# Patient Record
Sex: Female | Born: 1980 | Hispanic: Yes | Smoking: Never smoker
Health system: Southern US, Community
[De-identification: ages and names within clinical notes are randomized; demographics above are authoritative.]

## PROBLEM LIST (undated history)

## (undated) HISTORY — PX: APPENDECTOMY: SHX54

## (undated) HISTORY — PX: CHOLECYSTECTOMY: SHX55

---

## 2013-08-12 LAB — COMPREHENSIVE METABOLIC PANEL
Albumin: 4.1 g/dL (ref 3.4–5.0)
Alkaline Phosphatase: 82 U/L (ref 50–136)
Anion Gap: 9 (ref 7–16)
Bilirubin,Total: 1.2 mg/dL — ABNORMAL HIGH (ref 0.2–1.0)
Calcium, Total: 9 mg/dL (ref 8.5–10.1)
Chloride: 103 mmol/L (ref 98–107)
Co2: 25 mmol/L (ref 21–32)
Creatinine: 0.71 mg/dL (ref 0.60–1.30)
EGFR (Non-African Amer.): >60
Glucose: 112 mg/dL — ABNORMAL HIGH (ref 65–99)
Potassium: 3.7 mmol/L (ref 3.5–5.1)
SGOT(AST): 11 U/L — ABNORMAL LOW (ref 15–37)
SGPT (ALT): 16 U/L (ref 12–78)
Total Protein: 8.1 g/dL (ref 6.4–8.2)

## 2013-08-12 LAB — URINALYSIS, COMPLETE
Nitrite: NEGATIVE
Ph: 5 (ref 4.5–8.0)
Protein: 100
RBC,UR: 3 /HPF (ref 0–5)
Specific Gravity: 1.03 (ref 1.003–1.030)
Squamous Epithelial: 16

## 2013-08-12 LAB — CBC
HGB: 14 g/dL (ref 12.0–16.0)
MCH: 31.8 pg (ref 26.0–34.0)
MCV: 93 fL (ref 80–100)
Platelet: 201 10*3/uL (ref 150–440)
WBC: 13.8 10*3/uL — ABNORMAL HIGH (ref 3.6–11.0)

## 2013-08-13 ENCOUNTER — Ambulatory Visit: Payer: Self-pay | Admitting: Surgery

## 2014-11-14 IMAGING — CT CT ABD-PELV W/ CM
1 of 2 series · 15 of 32 positions shown, 19 images · non-contrast
Comparison: none

REASON FOR EXAM: (1) RLQ r/o appendicitis; (2) RLQ r/o appendicitis
COMMENTS:

PROCEDURE:     CT  - CT ABDOMEN / PELVIS  W  - August 13, 2013  [DATE]
RESULT:     CT abdomen and pelvis dated 08/13/1919 pain
TECHNIQUE: Helical 3 mm sections were obtained from lung bases the pubic
symphysis status post intravenous administration of 100 mL of Osovue-4II.

[Series 2: 3mm soft tissue · axial · 0.68mm/px · z∈[-1080,-646]mm · 15 of 159 slices shown, 19 images]
[im 7/159  soft-tissue]
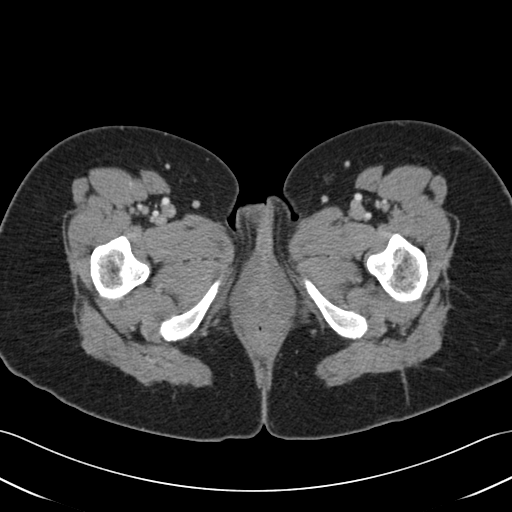
[im 7/159  bone]
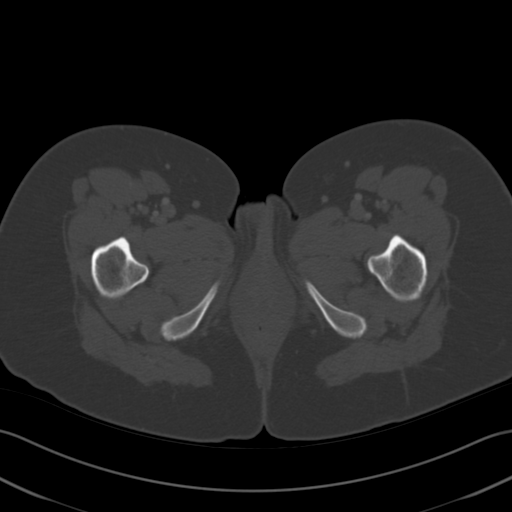
[im 19/159  soft-tissue]
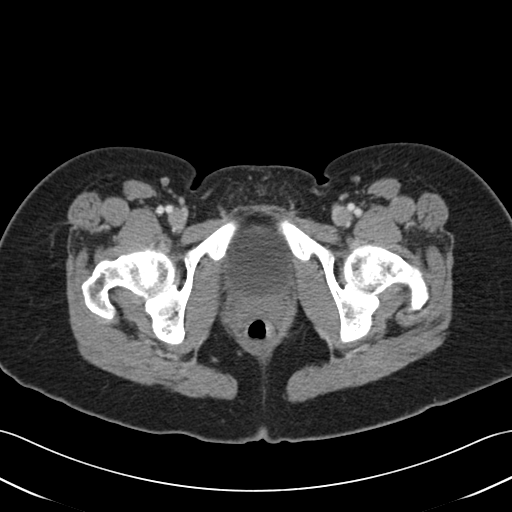
[im 32/159  soft-tissue]
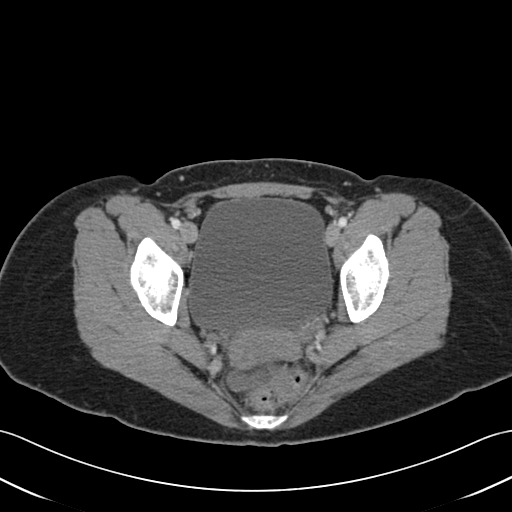
[im 45/159  soft-tissue]
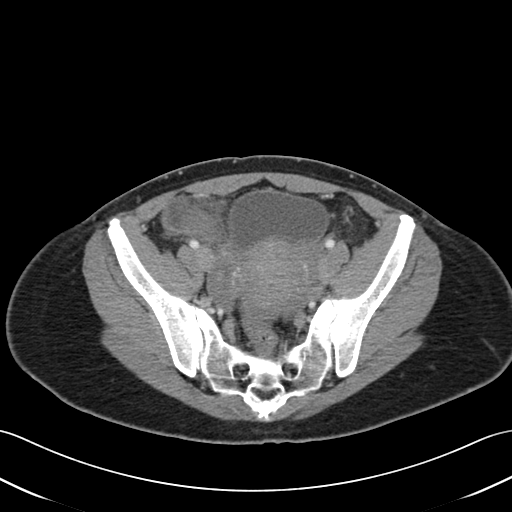
[im 57/159  soft-tissue]
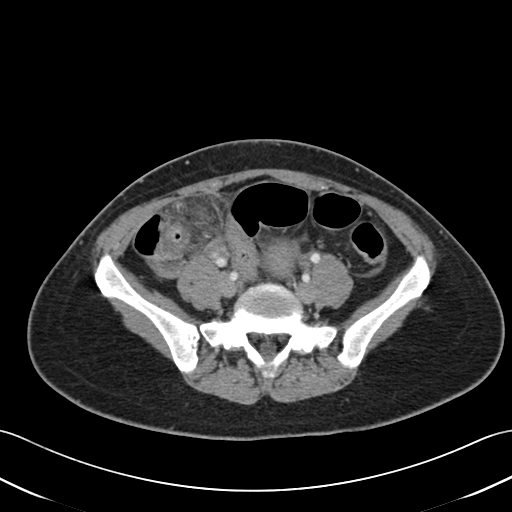
[im 70/159  soft-tissue]
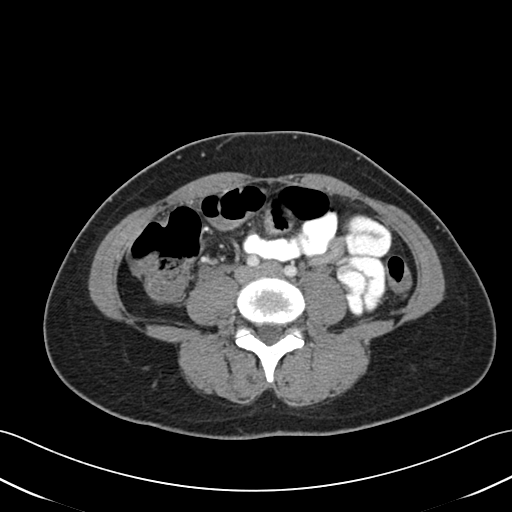
[im 83/159  soft-tissue]
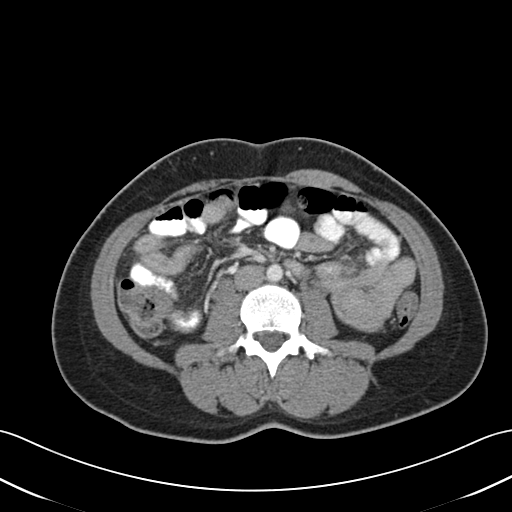
[im 89/159  soft-tissue]
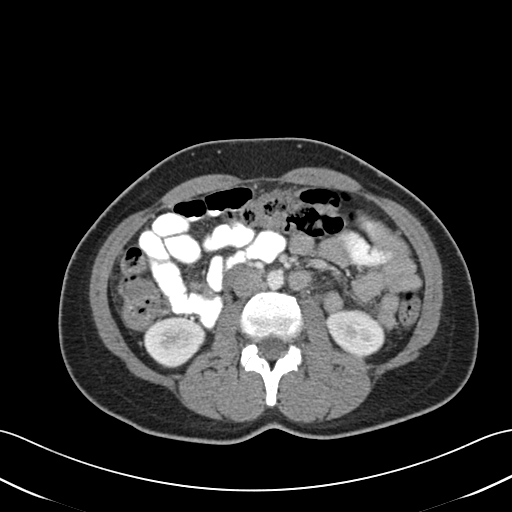
[im 102/159  soft-tissue]
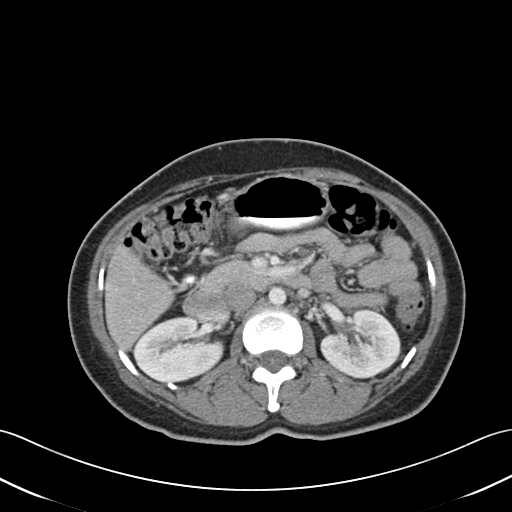
[im 102/159  bone]
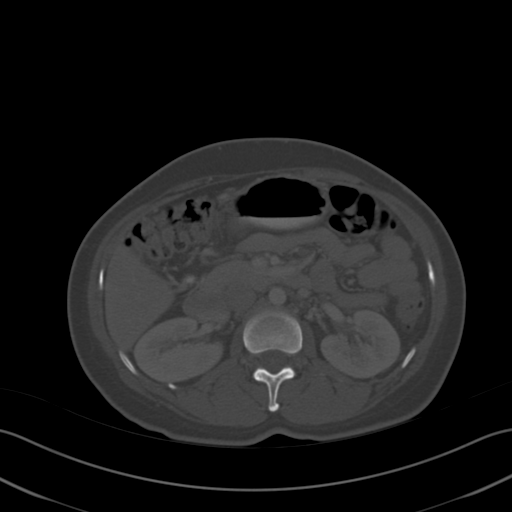
[im 114/159  soft-tissue]
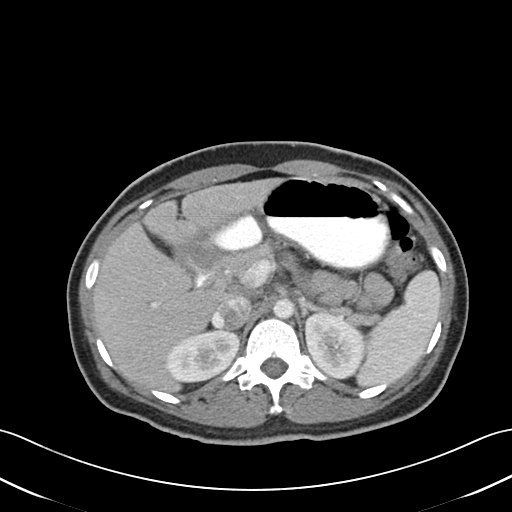
[im 127/159  soft-tissue]
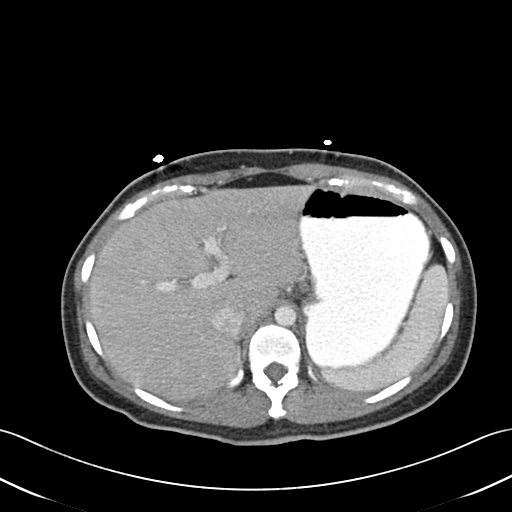
[im 133/159  lung]
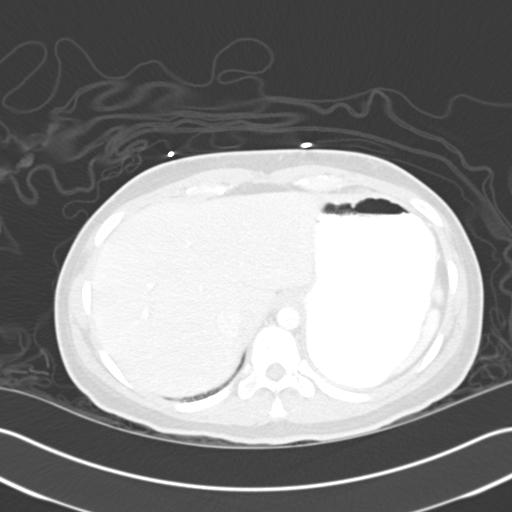
[im 140/159  soft-tissue]
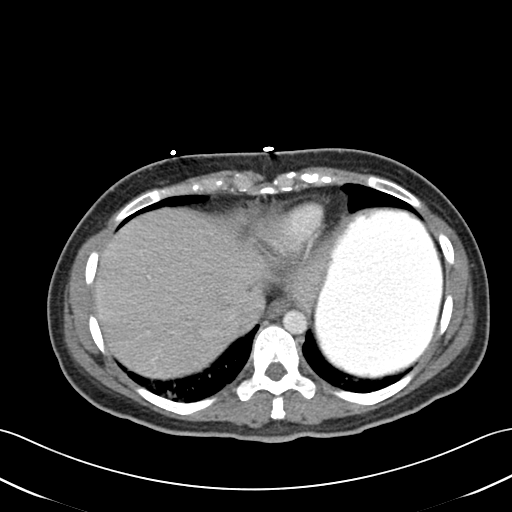
[im 140/159  lung]
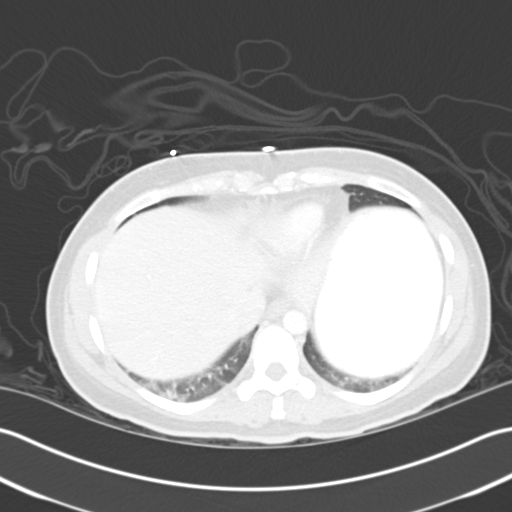
[im 146/159  lung]
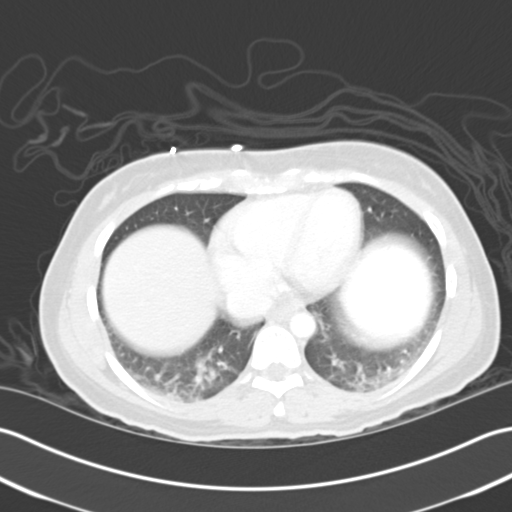
[im 152/159  soft-tissue]
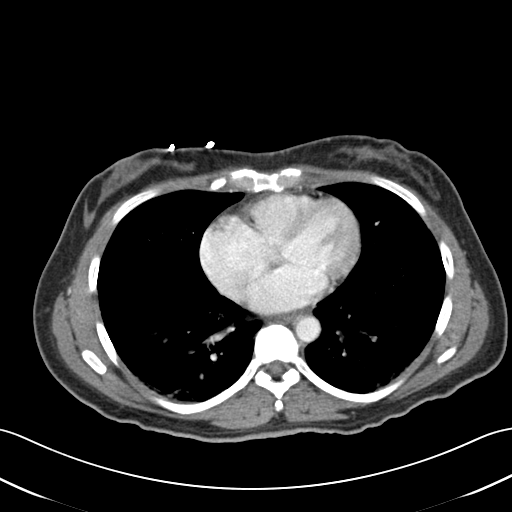
[im 152/159  lung]
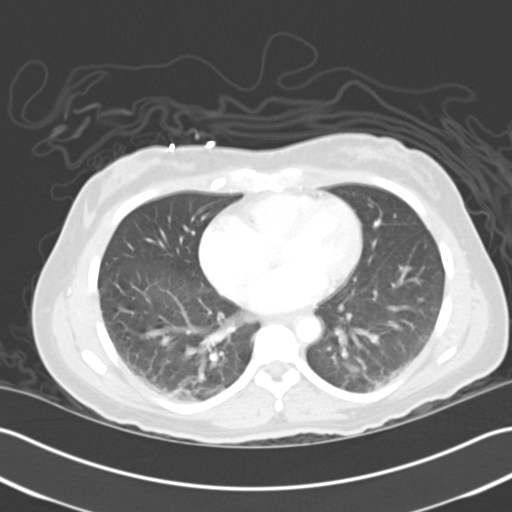

[15 of 32 positions shown; findings below may reference images not displayed]

FINDINGS: Minimal bibasal atelectasis appreciated. Patient status post
cholecystectomy. The liver, pancreas, spleen, adrenals kidneys are
unremarkable. There is no intra nor extrahepatic biliary ductal dilatation.

The appendix is dilated at 1.5 cm. There is diffuse distention and
inflammatory change within the appendiceal fat. Periappendiceal fluid is
identified without associated drainable loculated fluid collections.

Urinary bladder is distended. No evidence of pelvic free fluid loculated
fluid collections.
IMPRESSION: Acute appendicitis without evidence of perforation.
2. Dr. Pebrase of the emergency department was informed of these findings
via a preliminary faxed report.

## 2015-02-07 NOTE — H&P (Signed)
   Subjective/Chief Complaint RLQ pain x 2 days, nausea/vomiting   History of Present Illness [name] is a pleasant 34 yo F who presents with 2 days of RLQ pain.  Began acutely.  Worsening.  Also with nausea/vomiting.  Has never had before.  + subjective fevers/chills.   Past History s/p cholecystectomy   Past Med/Surgical Hx:  Cholecystectomy:   ALLERGIES:  No Known Allergies:   Family and Social History:  Family History Non-Contributory   Social History negative tobacco, negative ETOH, negative Illicit drugs   Place of Living Home   Review of Systems:  Subjective/Chief Complaint RLQ pain/N/V   Fever/Chills Yes   Cough No   Sputum No   Abdominal Pain Yes   Diarrhea No   Constipation No   Nausea/Vomiting Yes   SOB/DOE No   Chest Pain No   Dysuria No   Tolerating Diet No  Nauseated  Vomiting   Physical Exam:  GEN well developed, well nourished, no acute distress, Appears uncomfortable   HEENT pink conjunctivae, PERRL, moist oral mucosa   RESP normal resp effort  clear BS  no use of accessory muscles   CARD regular rate  no murmur  No LE edema   ABD positive tenderness  no hernia  soft  normal BS   EXTR negative cyanosis/clubbing, negative edema   SKIN normal to palpation, No rashes, No ulcers, skin turgor good   NEURO cranial nerves intact, negative rigidity, negative tremor, follows commands, strength:, motor/sensory function intact   PSYCH A+O to time, place, person, good insight, anxious    Assessment/Admission Diagnosis 34 yo F with RLQ pain/ nausea/vomiting.  + leukocytosis.  + enlarged fluid filled appendix with periappendiceal stranding.  Clinical and radiographic appendicitis.   Plan To OR for lap appendectomy   Electronic Signatures: Jarvis NewcomerLundquist, Keland Peyton A (MD)  (Signed 27-Oct-14 01:34)  Authored: CHIEF COMPLAINT and HISTORY, PAST MEDICAL/SURGIAL HISTORY, ALLERGIES, FAMILY AND SOCIAL HISTORY, REVIEW OF SYSTEMS, PHYSICAL EXAM,  ASSESSMENT AND PLAN   Last Updated: 27-Oct-14 01:34 by Jarvis NewcomerLundquist, Lennin Osmond A (MD)

## 2015-02-07 NOTE — Op Note (Signed)
PATIENT NAME:  Stephanie BellmanCHEVERRIA PINEDA, Jalynn MR#:  [id number] DATE OF BIRTH:  12-21-1980  DATE OF PROCEDURE:  08/13/2013   PREOPERATIVE DIAGNOSIS: Acute appendicitis.   POSTOPERATIVE DIAGNOSIS: Acute appendicitis.   PROCEDURE PERFORMED: Laparoscopic appendectomy.   ANESTHESIA: General.   ESTIMATED BLOOD LOSS: 10 mL.   COMPLICATIONS: None.   SPECIMEN: Appendix.   INDICATION FOR SURGERY: [name] is a pleasant 34 year old female who presents for two days of right lower quadrant pain, nausea and vomiting. She had a leukocytosis and CT finding, which would suggest acute appendicitis, she was thus brought to the operating room suite for laparoscopic appendectomy.   DETAILS OF THE PROCEDURE: Are as follows. Informed consent was obtained. [name] was brought to the operating room suite. She was placed on the operating room table. She was induced. Endotracheal tube was placed. General anesthesia was administered. Her abdomen was then prepped and draped in standard surgical fashion. A timeout was then performed correctly identifying the patient name, operative site and procedure to be performed An infraumbilical incision was made. It was deepened down to the fascia. The fascia was incised. The peritoneum was entered. A finger sweep was used to insure there were no adhesions. Two stay sutures were placed through the fasciotomy. [facility] through the abdomen. The abdomen was insufflated. A 5 mm trocar was placed in the left lower quadrant and suprapubic region.  A large inflammatory mass was found in the right abdomen.  The appendix was meticulously dissected out from adhesed omentum. I then created a hole at the base of the appendix and the mesoappendix and I then stapled  across the base of the appendix flush with the cecum with an endoscopic stapler. I then used 2 staple loads to ligate the mesoappendix. The appendix was taken out through an Endo Catch bag through the  supraumbilical trocar. The abdomen was then irrigated. The staple lines were then examined and noted to be hemostatic. I then re-evaluated the anatomy. I was then able to identify the ovary, tube, terminal ileum and cecum. I was also able to visualize the ureter, which was still protected under peritoneum and the retroperitoneum. Trocar was then taken and placed under direct visualization. Abdomen was desufflated. The infraumbilical fascia was closed with a figure-of-eight 0 Vicryl. All skin incisions were then closed with interrupted 4-0 Monocryl deep dermal sutures. The wounds were then dressed with Steri-Strips, Telfa gauze and Tegaderm. The patient was then awoken, extubated and brought to the postanesthesia care unit. There were no immediate complications. Needle, sponge, and instrument counts correct at the end of the procedure.    ____________________________ Si Raiderhristopher A. Caitlinn Klinker, MD cal:sg D: 08/13/2013 03:49:10 ET T: 08/13/2013 07:05:40 ET JOB#: [id number]  cc: Cristal Deerhristopher A. Aftan Vint, MD, <Dictator> Jarvis NewcomerHRISTOPHER A Raechell Singleton MD ELECTRONICALLY SIGNED 08/20/2013 18:26

## 2017-01-07 ENCOUNTER — Encounter: Payer: Self-pay | Admitting: Emergency Medicine

## 2017-01-07 ENCOUNTER — Emergency Department: Payer: Self-pay

## 2017-01-07 ENCOUNTER — Emergency Department
Admission: EM | Admit: 2017-01-07 | Discharge: 2017-01-07 | Disposition: A | Discharge status: Home / Self Care | Payer: Self-pay | Attending: Emergency Medicine | Admitting: Emergency Medicine

## 2017-01-07 DIAGNOSIS — R509 Fever, unspecified: Secondary | ICD-10-CM | POA: Insufficient documentation

## 2017-01-07 DIAGNOSIS — R05 Cough: Secondary | ICD-10-CM | POA: Insufficient documentation

## 2017-01-07 DIAGNOSIS — J029 Acute pharyngitis, unspecified: Secondary | ICD-10-CM | POA: Insufficient documentation

## 2017-01-07 DIAGNOSIS — R69 Illness, unspecified: Secondary | ICD-10-CM

## 2017-01-07 DIAGNOSIS — J111 Influenza due to unidentified influenza virus with other respiratory manifestations: Secondary | ICD-10-CM

## 2017-01-07 DIAGNOSIS — R51 Headache: Secondary | ICD-10-CM | POA: Insufficient documentation

## 2017-01-07 MED ORDER — HYDROCOD POLST-CPM POLST ER 10-8 MG/5ML PO SUER
5.0000 mL | Freq: Once | ORAL | Status: AC
  Administered 2017-01-07: 5 mL via ORAL
  Filled 2017-01-07: qty 5

## 2017-01-07 MED ORDER — PSEUDOEPHEDRINE HCL ER 120 MG PO TB12: 120.0000 mg | ORAL_TABLET | Freq: Two times a day (BID) | ORAL | 0 refills | Status: AC | PRN

## 2017-01-07 MED ORDER — IBUPROFEN 600 MG PO TABS: 600.0000 mg | ORAL_TABLET | Freq: Three times a day (TID) | ORAL | 0 refills | Status: AC | PRN

## 2017-01-07 MED ORDER — KETOROLAC TROMETHAMINE 60 MG/2ML IM SOLN
60.0000 mg | Freq: Once | INTRAMUSCULAR | Status: AC
  Administered 2017-01-07: 60 mg via INTRAMUSCULAR
  Filled 2017-01-07: qty 2

## 2017-01-07 MED ORDER — HYDROCOD POLST-CPM POLST ER 10-8 MG/5ML PO SUER: 5.0000 mL | Freq: Two times a day (BID) | ORAL | 0 refills | Status: AC

## 2017-01-07 NOTE — ED Triage Notes (Signed)
Pt states that on Saturday she started having fever, headache, sore throat, and cough. Pt was seen at PCP office and diagnosed with flu. Pt states that she was given medication but she is still not feeling well. Pt states that she is still coughing and having trouble breathing. Pt in NAD in triage.

## 2017-01-07 NOTE — ED Notes (Signed)
Pt c/o dry cough and fever since Monday. Taking antibiotic from PCP with no relief.

## 2017-01-07 NOTE — ED Provider Notes (Signed)
[facility] Emergency Department Provider Note   ____________________________________________   First MD Initiated Contact with Patient 01/07/17 1710     (approximate)  I have reviewed the triage vital signs and the nursing notes.   HISTORY  Chief Complaint Cough    HPI Aleighna Hester Joslin is a 36 y.o. female patient complaining of fever, headache, sore throat, cough, and body aches for 6 days. Patient states she was diagnosed influenza 6 days ago and given Tamiflu. Patient states continue myalgia and cough. Patient also states hurts to take a deep breath. Patient denies nausea vomiting or diarrhea. No palliative measures for her complaint.   History reviewed. No pertinent past medical history.  There are no active problems to display for this patient.   Past Surgical History:  Procedure Laterality Date  . APPENDECTOMY    . CHOLECYSTECTOMY      Prior to Admission medications   Medication Sig Start Date End Date Taking? Authorizing Provider  chlorpheniramine-HYDROcodone (TUSSIONEX PENNKINETIC ER) 10-8 MG/5ML SUER Take 5 mLs by mouth 2 (two) times daily. 01/07/17   Joni Reining, PA-C  ibuprofen (ADVIL,MOTRIN) 600 MG tablet Take 1 tablet (600 mg total) by mouth every 8 (eight) hours as needed. 01/07/17   Joni Reining, PA-C  pseudoephedrine (SUDAFED) 120 MG 12 hr tablet Take 1 tablet (120 mg total) by mouth 2 (two) times daily as needed for congestion. 01/07/17 01/07/18  Joni Reining, PA-C    Allergies Patient has no known allergies.  No family history on file.  Social History Social History  Substance Use Topics  . Smoking status: Not on file  . Smokeless tobacco: Never Used  . Alcohol use No    Review of Systems Constitutional: Fever/chills. Body aches, and cough. Eyes: No visual changes. ENT: Sore throat Cardiovascular: Denies chest pain. Respiratory: Denies shortness of breath. Gastrointestinal: No abdominal pain.  No  nausea, no vomiting.  No diarrhea.  No constipation. Genitourinary: Negative for dysuria. Musculoskeletal: Negative for back pain. Skin: Negative for rash. Neurological: Negative for headaches, focal weakness or numbness.    ____________________________________________   PHYSICAL EXAM:  VITAL SIGNS: ED Triage Vitals  Enc Vitals Group     BP 01/07/17 1656 117/88     Pulse Rate 01/07/17 1655 (!) 103     Resp 01/07/17 1655 16     Temp 01/07/17 1655 98.7 F (37.1 C)     Temp Source 01/07/17 1655 Oral     SpO2 01/07/17 1655 100 %     Weight 01/07/17 1654 123 lb (55.8 kg)     Height --      Head Circumference --      Peak Flow --      Pain Score 01/07/17 1654 8     Pain Loc --      Pain Edu? --      Excl. in GC? --     Constitutional: Alert and oriented. Appears malaise  Eyes: Conjunctivae are normal. PERRL. EOMI. Head: Atraumatic. Nose: Edematous nasal tube was clear rhinorrhea Mouth/Throat: Mucous membranes are moist.  Oropharynx non-erythematous. Postnasal drainage Neck: No stridor.  No cervical spine tenderness to palpation. Hematological/Lymphatic/Immunilogical: No cervical lymphadenopathy. Cardiovascular: Normal rate, regular rhythm. Grossly normal heart sounds.  Good peripheral circulation. Respiratory: Normal respiratory effort.  No retractions. Lungs CTAB. No productive cough Gastrointestinal: Soft and nontender. No distention. No abdominal bruits. No CVA tenderness. Musculoskeletal: No lower extremity tenderness nor edema.  No joint effusions. Neurologic:  Normal speech and  language. No gross focal neurologic deficits are appreciated. No gait instability. Skin:  Skin is warm, dry and intact. No rash noted. Psychiatric: Mood and affect are normal. Speech and behavior are normal.  ____________________________________________   LABS (all labs ordered are listed, but only abnormal results are displayed)  Labs Reviewed - No data to  display ____________________________________________  EKG   ____________________________________________  RADIOLOGY  No acute findings on chest x-ray. ____________________________________________   PROCEDURES  Procedure(s) performed: None  Procedures  Critical Care performed: No  ____________________________________________   INITIAL IMPRESSION / ASSESSMENT AND PLAN / ED COURSE  Pertinent labs & imaging results that were available during my care of the patient were reviewed by me and considered in my medical decision making (see chart for details).  Viral illness. Patient given discharge care instructions. Patient given a prescription for Tussionex, Sudafed, and ibuprofen. Patient given a work no. Patient advised to follow-up family doctor condition persists.      ____________________________________________   FINAL CLINICAL IMPRESSION(S) / ED DIAGNOSES  Final diagnoses:  Influenza-like illness      NEW MEDICATIONS STARTED DURING THIS VISIT:  New Prescriptions   CHLORPHENIRAMINE-HYDROCODONE (TUSSIONEX PENNKINETIC ER) 10-8 MG/5ML SUER    Take 5 mLs by mouth 2 (two) times daily.   IBUPROFEN (ADVIL,MOTRIN) 600 MG TABLET    Take 1 tablet (600 mg total) by mouth every 8 (eight) hours as needed.   PSEUDOEPHEDRINE (SUDAFED) 120 MG 12 HR TABLET    Take 1 tablet (120 mg total) by mouth 2 (two) times daily as needed for congestion.     Note:  This document was prepared using Dragon voice recognition software and may include unintentional dictation errors.    Joni Reiningonald K Necha Harries, PA-C 01/07/17 1744    Merrily BrittleNeil Rifenbark, MD 01/07/17 Harrietta [id number]

## 2017-01-13 ENCOUNTER — Encounter: Payer: Self-pay | Admitting: Emergency Medicine

## 2017-01-13 ENCOUNTER — Inpatient Hospital Stay
Admission: EM | Admit: 2017-01-13 | Discharge: 2017-01-15 | DRG: 872 | Disposition: A | Discharge status: Home / Self Care | Payer: Medicaid Other | Attending: Internal Medicine | Admitting: Internal Medicine

## 2017-01-13 ENCOUNTER — Emergency Department: Payer: Medicaid Other

## 2017-01-13 DIAGNOSIS — E876 Hypokalemia: Secondary | ICD-10-CM | POA: Diagnosis present

## 2017-01-13 DIAGNOSIS — Z79899 Other long term (current) drug therapy: Secondary | ICD-10-CM | POA: Diagnosis not present

## 2017-01-13 DIAGNOSIS — Z9049 Acquired absence of other specified parts of digestive tract: Secondary | ICD-10-CM

## 2017-01-13 DIAGNOSIS — A419 Sepsis, unspecified organism: Principal | ICD-10-CM | POA: Diagnosis present

## 2017-01-13 DIAGNOSIS — J020 Streptococcal pharyngitis: Secondary | ICD-10-CM | POA: Diagnosis present

## 2017-01-13 DIAGNOSIS — N390 Urinary tract infection, site not specified: Secondary | ICD-10-CM | POA: Diagnosis present

## 2017-01-13 DIAGNOSIS — B349 Viral infection, unspecified: Secondary | ICD-10-CM

## 2017-01-13 LAB — BASIC METABOLIC PANEL
Anion gap: 8 (ref 5–15)
BUN: 9 mg/dL (ref 6–20)
CHLORIDE: 101 mmol/L (ref 101–111)
CO2: 22 mmol/L (ref 22–32)
Calcium: 8.4 mg/dL — ABNORMAL LOW (ref 8.9–10.3)
Creatinine, Ser: 0.68 mg/dL (ref 0.44–1.00)
GFR calc Af Amer: >60 mL/min (ref >60)
GFR calc non Af Amer: >60 mL/min (ref >60)
GLUCOSE: 101 mg/dL — AB (ref 65–99)
POTASSIUM: 3.4 mmol/L — AB (ref 3.5–5.1)
Sodium: 131 mmol/L — ABNORMAL LOW (ref 135–145)

## 2017-01-13 LAB — CBC
HCT: 38.7 % (ref 35.0–47.0)
HEMOGLOBIN: 13.4 g/dL (ref 12.0–16.0)
MCH: 31.9 pg (ref 26.0–34.0)
MCHC: 34.6 g/dL (ref 32.0–36.0)
MCV: 92.1 fL (ref 80.0–100.0)
Platelets: 167 10*3/uL (ref 150–440)
RBC: 4.2 MIL/uL (ref 3.80–5.20)
RDW: 12.6 % (ref 11.5–14.5)
WBC: 12.6 10*3/uL — ABNORMAL HIGH (ref 3.6–11.0)

## 2017-01-13 LAB — TROPONIN I

## 2017-01-13 LAB — URINALYSIS, COMPLETE (UACMP) WITH MICROSCOPIC
BILIRUBIN URINE: NEGATIVE
Glucose, UA: NEGATIVE mg/dL
Ketones, ur: 20 mg/dL — AB
LEUKOCYTES UA: NEGATIVE
Nitrite: NEGATIVE
PROTEIN: NEGATIVE mg/dL
SPECIFIC GRAVITY, URINE: 1.005 (ref 1.005–1.030)
pH: 7 (ref 5.0–8.0)

## 2017-01-13 LAB — INFLUENZA PANEL BY PCR (TYPE A & B)
INFLBPCR: NEGATIVE
Influenza A By PCR: NEGATIVE

## 2017-01-13 LAB — TSH: TSH: 0.372 u[IU]/mL (ref 0.350–4.500)

## 2017-01-13 LAB — PREGNANCY, URINE: Preg Test, Ur: NEGATIVE

## 2017-01-13 LAB — LACTIC ACID, PLASMA: LACTIC ACID, VENOUS: 1.1 mmol/L (ref 0.5–1.9)

## 2017-01-13 LAB — MONONUCLEOSIS SCREEN: Mono Screen: NEGATIVE

## 2017-01-13 LAB — POCT RAPID STREP A: Streptococcus, Group A Screen (Direct): POSITIVE — AB

## 2017-01-13 MED ORDER — ONDANSETRON HCL 4 MG/2ML IJ SOLN: 4.0000 mg | Freq: Four times a day (QID) | INTRAMUSCULAR | Status: DC | PRN

## 2017-01-13 MED ORDER — KETOROLAC TROMETHAMINE 30 MG/ML IJ SOLN
15.0000 mg | Freq: Once | INTRAMUSCULAR | Status: AC
  Administered 2017-01-13: 15 mg via INTRAVENOUS
  Filled 2017-01-13: qty 1

## 2017-01-13 MED ORDER — CEFTRIAXONE SODIUM-DEXTROSE 1-3.74 GM-% IV SOLR
1.0000 g | Freq: Once | INTRAVENOUS | Status: AC
  Administered 2017-01-13: 1 g via INTRAVENOUS
  Filled 2017-01-13: qty 50

## 2017-01-13 MED ORDER — DOXYCYCLINE HYCLATE 100 MG PO CAPS: 100.0000 mg | ORAL_CAPSULE | Freq: Two times a day (BID) | ORAL | 0 refills | Status: DC

## 2017-01-13 MED ORDER — ACETAMINOPHEN 325 MG PO TABS
650.0000 mg | ORAL_TABLET | Freq: Four times a day (QID) | ORAL | Status: DC | PRN
Start: 2017-01-13 — End: 2017-01-15
  Administered 2017-01-13 – 2017-01-14 (×2): 650 mg via ORAL
  Filled 2017-01-13 (×2): qty 2

## 2017-01-13 MED ORDER — ONDANSETRON HCL 4 MG PO TABS: 4.0000 mg | ORAL_TABLET | Freq: Four times a day (QID) | ORAL | Status: DC | PRN

## 2017-01-13 MED ORDER — IBUPROFEN 400 MG PO TABS: 600.0000 mg | ORAL_TABLET | Freq: Four times a day (QID) | ORAL | Status: DC | PRN

## 2017-01-13 MED ORDER — KETOROLAC TROMETHAMINE 15 MG/ML IJ SOLN
15.0000 mg | Freq: Four times a day (QID) | INTRAMUSCULAR | Status: DC | PRN
  Administered 2017-01-14 (×3): 15 mg via INTRAVENOUS
  Filled 2017-01-13 (×3): qty 1

## 2017-01-13 MED ORDER — DEXTROSE 5 % IV SOLN: 1.0000 g | Freq: Once | INTRAVENOUS | Status: DC

## 2017-01-13 MED ORDER — SODIUM CHLORIDE 0.9 % IV BOLUS (SEPSIS)
500.0000 mL | Freq: Once | INTRAVENOUS | Status: AC
  Administered 2017-01-13: 500 mL via INTRAVENOUS

## 2017-01-13 MED ORDER — ONDANSETRON HCL 4 MG PO TABS: 4.0000 mg | ORAL_TABLET | Freq: Three times a day (TID) | ORAL | 0 refills | Status: AC | PRN

## 2017-01-13 MED ORDER — AZITHROMYCIN 500 MG PO TABS
500.0000 mg | ORAL_TABLET | Freq: Once | ORAL | Status: AC
  Administered 2017-01-13: 500 mg via ORAL
  Filled 2017-01-13: qty 1

## 2017-01-13 MED ORDER — ONDANSETRON HCL 4 MG/2ML IJ SOLN
4.0000 mg | Freq: Once | INTRAMUSCULAR | Status: AC
  Administered 2017-01-13: 4 mg via INTRAVENOUS
  Filled 2017-01-13: qty 2

## 2017-01-13 MED ORDER — ACETAMINOPHEN 650 MG RE SUPP: 650.0000 mg | Freq: Four times a day (QID) | RECTAL | Status: DC | PRN

## 2017-01-13 MED ORDER — SODIUM CHLORIDE 0.9 % IV BOLUS (SEPSIS)
1000.0000 mL | Freq: Once | INTRAVENOUS | Status: AC
  Administered 2017-01-13: 1000 mL via INTRAVENOUS

## 2017-01-13 MED ORDER — SODIUM CHLORIDE 0.9 % IV SOLN
INTRAVENOUS | Status: DC
Start: 2017-01-13 — End: 2017-01-15
  Administered 2017-01-13: 100 mL/h via INTRAVENOUS
  Administered 2017-01-14: 17:00:00 via INTRAVENOUS

## 2017-01-13 MED ORDER — ENOXAPARIN SODIUM 40 MG/0.4ML ~~LOC~~ SOLN
40.0000 mg | SUBCUTANEOUS | Status: DC
  Administered 2017-01-13 – 2017-01-14 (×2): 40 mg via SUBCUTANEOUS
  Filled 2017-01-13 (×2): qty 0.4

## 2017-01-13 MED ORDER — SODIUM CHLORIDE 0.9% FLUSH
3.0000 mL | Freq: Two times a day (BID) | INTRAVENOUS | Status: DC
  Administered 2017-01-13: 3 mL via INTRAVENOUS

## 2017-01-13 MED ORDER — SODIUM CHLORIDE 0.9 % IV SOLN
3.0000 g | Freq: Four times a day (QID) | INTRAVENOUS | Status: DC
  Administered 2017-01-14 – 2017-01-15 (×6): 3 g via INTRAVENOUS
  Filled 2017-01-13 (×9): qty 3

## 2017-01-13 MED ORDER — HYDROCOD POLST-CPM POLST ER 10-8 MG/5ML PO SUER
5.0000 mL | Freq: Two times a day (BID) | ORAL | Status: DC
  Administered 2017-01-13 – 2017-01-15 (×4): 5 mL via ORAL
  Filled 2017-01-13 (×4): qty 5

## 2017-01-13 MED ORDER — ACETAMINOPHEN 500 MG PO TABS
1000.0000 mg | ORAL_TABLET | Freq: Once | ORAL | Status: AC
  Administered 2017-01-13: 1000 mg via ORAL
  Filled 2017-01-13: qty 2

## 2017-01-13 MED ORDER — SODIUM CHLORIDE 0.9 % IV BOLUS (SEPSIS)
250.0000 mL | Freq: Once | INTRAVENOUS | Status: AC
  Administered 2017-01-13: 250 mL via INTRAVENOUS

## 2017-01-13 MED ORDER — SODIUM CHLORIDE 0.9 % IV SOLN: 250.0000 mL | INTRAVENOUS | Status: DC | PRN

## 2017-01-13 MED ORDER — SODIUM CHLORIDE 0.9% FLUSH: 3.0000 mL | INTRAVENOUS | Status: DC | PRN

## 2017-01-13 NOTE — ED Provider Notes (Signed)
-----------------------------------------   5:17 PM on 01/13/2017 -----------------------------------------  Patient care assumed from Dr. Don PerkingVeronese. For the past one week the patient has been coughing with fever bodyaches generalized weakness and fatigue. Upon arrival the patient had a fever of 103, is tachycardic and has remained hypotensive despite adequate IV fluids in the emergency department. Patient continues have a frequent cough during examination. Patient's labs show a positive strep throat screen. However given the patient's significant cough congestion bodyaches headache sore throat, I find it hard to believe that strep throat would be the cause of all of her symptoms and she is possibly streptococcal carrier. Patient is urinalysis shows ketones and many bacteria although does not show any white cells or red cells. Patient's pregnancy test is negative. Lactic acid is 1.1. Influenza PCR is negative. Patient's chemistry is nonrevealing. CBC shows a mild leukocytosis of 12,600, otherwise normal. Mononucleosis is negative. Chest x-rays negative. Patient states she continues to feel unwell. Frequent cough during examination. Patient remains hypotensive. Patient has received IV antibiotics in the emergency department which should be sufficient coverage for both streptococcal infection as well as urinary tract infection. Given the patient's persistent hypotension and she will likely require admission to the hospital. Currently awaiting Spanish interpreter to speak more in depth to the patient regarding her workup today.  Discussed the results with the Spanish interpreter present. Patient remains hypotensive currently 82 systolic. As the patient continues to have fevers with hypotension we'll admit to the hospital for further workup and continued treatment.   Minna AntisKevin Vinia Jemmott, MD 01/13/17 1736

## 2017-01-13 NOTE — ED Notes (Signed)
Interpretor paged for dr Lenard Lancepaduchowski.

## 2017-01-13 NOTE — ED Notes (Signed)
Dr Lenard Lancepaduchowski notified of blood pressure

## 2017-01-13 NOTE — Progress Notes (Signed)
Pharmacy Antibiotic Note  Stephanie Sims is a 36 y.o. female admitted on 01/13/2017 with sepsis secondary to strep pharyngitis.  Pharmacy has been consulted for ampicillin/sulbactam dosing.  Patient received one dose of CTX and azithromycin in the ED. Is being started on ampicillin/sulbactam on admission.  Plan: Will order ampicillin/sulbactam 3 g IV q6h   Height: 5\' 2"  (157.5 cm) Weight: 123 lb (55.8 kg) IBW/kg (Calculated) : 50.1  Temp (24hrs), Avg:101.9 F (38.8 C), Min:100.7 F (38.2 C), Max:103 F (39.4 C)   Recent Labs Lab 01/13/17 1335 01/13/17 1403  WBC 12.6*  --   CREATININE 0.68  --   LATICACIDVEN  --  1.1    Estimated Creatinine Clearance: 76.9 mL/min (by C-G formula based on SCr of 0.68 mg/dL).    No Known Allergies  Antimicrobials this admission: CTX and azithromycin one dose in ED on 3/29 Ampicillin/sulbactam 3/29 >>   Dose adjustments this admission:  Microbiology results: 3/29 BCx: Sent 3/29 UCx: Sent   Thank you for allowing pharmacy to be a part of this patient's care.  Cindi CarbonMary M Amarian Botero, PharmD, BCPS Clinical Pharmacist 01/13/2017 6:52 PM

## 2017-01-13 NOTE — ED Notes (Signed)
CODE SEPSIS CALLED TO DOUG AT [facility] 

## 2017-01-13 NOTE — ED Provider Notes (Signed)
[facility] Emergency Department Provider Note  ____________________________________________  Time seen: Approximately 1:58 PM  I have reviewed the triage vital signs and the nursing notes.   HISTORY  Chief Complaint Near Syncope   HPI Stephanie Sims is a 36 y.o. female no significant past medical history who presents for evaluation of fever and body aches. Patient reports that 2 weeks ago she was diagnosed with the flu and finished a course of Tamiflu. Her fevers went away and started again a week ago. For the last week she has had fever as high as 103F, body aches, cough, congestion, sore throat, HA, nausea, multiple episodes of nonbloody nonbilious emesis. She denies diarrhea, chest pain, abdominal pain, dysuria hematuria, vaginal discharge. Patient hasn't been eating and drinking for the last few days.  History reviewed. No pertinent past medical history.  Patient Active Problem List   Diagnosis Date Noted  . Sepsis (HCC) 01/13/2017    Past Surgical History:  Procedure Laterality Date  . APPENDECTOMY    . CHOLECYSTECTOMY      Prior to Admission medications   Medication Sig Start Date End Date Taking? Authorizing Provider  chlorpheniramine-HYDROcodone (TUSSIONEX PENNKINETIC ER) 10-8 MG/5ML SUER Take 5 mLs by mouth 2 (two) times daily. 01/07/17  Yes Joni Reining, PA-C  ibuprofen (ADVIL,MOTRIN) 600 MG tablet Take 1 tablet (600 mg total) by mouth every 8 (eight) hours as needed. 01/07/17  Yes Joni Reining, PA-C  pseudoephedrine (SUDAFED) 120 MG 12 hr tablet Take 1 tablet (120 mg total) by mouth 2 (two) times daily as needed for congestion. 01/07/17 01/07/18 Yes Joni Reining, PA-C  doxycycline (VIBRAMYCIN) 100 MG capsule Take 1 capsule (100 mg total) by mouth 2 (two) times daily. 01/13/17 01/20/17  Nita Sickle, MD  ondansetron (ZOFRAN) 4 MG tablet Take 1 tablet (4 mg total) by mouth every 8 (eight) hours as needed for nausea or  vomiting. 01/13/17   Nita Sickle, MD    Allergies Patient has no known allergies.  History reviewed. No pertinent family history.  Social History Social History  Substance Use Topics  . Smoking status: Never Smoker  . Smokeless tobacco: Never Used  . Alcohol use No    Review of Systems  Constitutional: + fever, chills, body aches, generalized weakness Eyes: Negative for visual changes. ENT: + sore throat. Neck: No neck pain  Cardiovascular: Negative for chest pain. Respiratory: Negative for shortness of breath. + cough Gastrointestinal: Negative for abdominal pain, diarrhea. + N/V Genitourinary: Negative for dysuria. Musculoskeletal: Negative for back pain. Skin: Negative for rash. Neurological: Negative for  weakness or numbness. + HA Psych: No SI or HI  ____________________________________________   PHYSICAL EXAM:  VITAL SIGNS: ED Triage Vitals  Enc Vitals Group     BP 01/13/17 1321 (!) 93/58     Pulse Rate 01/13/17 1321 (!) 111     Resp 01/13/17 1321 18     Temp 01/13/17 1321 (!) 103 F (39.4 C)     Temp Source 01/13/17 1321 Oral     SpO2 01/13/17 1321 100 %     Weight 01/13/17 1329 123 lb (55.8 kg)     Height 01/13/17 1329 5\' 2"  (1.575 m)     Head Circumference --      Peak Flow --      Pain Score 01/13/17 1329 10     Pain Loc --      Pain Edu? --      Excl. in GC? --  Constitutional: Alert and oriented, looks dehydrated and mild distress due to body aches.  HEENT:      Head: Normocephalic and atraumatic.         Eyes: Conjunctivae are normal. Sclera is non-icteric. EOMI. PERRL      Mouth/Throat: Mucous membranes are dry. Oropharynx is clear with no exudates       Neck: Supple with no signs of meningismus. Cardiovascular: Tachycardic with regular rhythm. No murmurs, gallops, or rubs. 2+ symmetrical distal pulses are present in all extremities. No JVD. Respiratory: Normal respiratory effort. Lungs are clear to auscultation bilaterally. No  wheezes, crackles, or rhonchi.  Gastrointestinal: Soft, non tender, and non distended with positive bowel sounds. No rebound or guarding. Genitourinary: No CVA tenderness. Musculoskeletal: Nontender with normal range of motion in all extremities. No edema, cyanosis, or erythema of extremities. Neurologic: Normal speech and language. Face is symmetric. Moving all extremities. No gross focal neurologic deficits are appreciated. Skin: Skin is warm, dry and intact. No rash noted. Psychiatric: Mood and affect are normal. Speech and behavior are normal.  ____________________________________________   LABS (all labs ordered are listed, but only abnormal results are displayed)  Labs Reviewed  BASIC METABOLIC PANEL - Abnormal; Notable for the following:       Result Value   Sodium 131 (*)    Potassium 3.4 (*)    Glucose, Bld 101 (*)    Calcium 8.4 (*)    All other components within normal limits  CBC - Abnormal; Notable for the following:    WBC 12.6 (*)    All other components within normal limits  URINALYSIS, COMPLETE (UACMP) WITH MICROSCOPIC - Abnormal; Notable for the following:    Color, Urine YELLOW (*)    APPearance CLEAR (*)    Hgb urine dipstick SMALL (*)    Ketones, ur 20 (*)    Bacteria, UA MANY (*)    Squamous Epithelial / LPF 0-5 (*)    All other components within normal limits  CBC - Abnormal; Notable for the following:    RBC 3.58 (*)    Hemoglobin 11.5 (*)    HCT 33.0 (*)    Platelets 140 (*)    All other components within normal limits  BASIC METABOLIC PANEL - Abnormal; Notable for the following:    Potassium 3.3 (*)    Chloride 112 (*)    Calcium 7.4 (*)    Anion gap 3 (*)    All other components within normal limits  CBC WITH DIFFERENTIAL/PLATELET - Abnormal; Notable for the following:    RBC 3.46 (*)    Hemoglobin 11.2 (*)    HCT 32.1 (*)    Platelets 144 (*)    All other components within normal limits  BASIC METABOLIC PANEL - Abnormal; Notable for the  following:    Chloride 112 (*)    Calcium 7.8 (*)    Anion gap 3 (*)    All other components within normal limits  POCT RAPID STREP A - Abnormal; Notable for the following:    Streptococcus, Group A Screen (Direct) POSITIVE (*)    All other components within normal limits  CULTURE, BLOOD (ROUTINE X 2)  CULTURE, BLOOD (ROUTINE X 2)  URINE CULTURE  LACTIC ACID, PLASMA  TROPONIN I  PREGNANCY, URINE  INFLUENZA PANEL BY PCR (TYPE A & B)  MONONUCLEOSIS SCREEN  HEMOGLOBIN A1C  TSH  HIV ANTIBODY (ROUTINE TESTING)  CBG MONITORING, ED   ____________________________________________  EKG  ED ECG REPORT I, [place]  Don Perking, the attending physician, personally viewed and interpreted this ECG.  NSR, normal intervals, normal axis, no STE or depressions. NO prior for comparison ____________________________________________  RADIOLOGY  CXR:  Negative ____________________________________________   PROCEDURES  Procedure(s) performed: None Procedures Critical Care performed: yes  CRITICAL CARE Performed by: Nita Sickle  ?  Total critical care time: 35 min  Critical care time was exclusive of separately billable procedures and treating other patients.  Critical care was necessary to treat or prevent imminent or life-threatening deterioration.  Critical care was time spent personally by me on the following activities: development of treatment plan with patient and/or surrogate as well as nursing, discussions with consultants, evaluation of patient's response to treatment, examination of patient, obtaining history from patient or surrogate, ordering and performing treatments and interventions, ordering and review of laboratory studies, ordering and review of radiographic studies, pulse oximetry and re-evaluation of patient's condition.  ____________________________________________   INITIAL IMPRESSION / ASSESSMENT AND PLAN / ED COURSE  36 y.o. female no significant past  medical history who presents for evaluation of fever and body aches. On arrival patient meets sepsis criteria with fever of 103F, tachycardic to 111 and hypotensive with BP of 93/58, she looks dehydrated on exam, lungs are clear, no meningeal signs, abdomen is soft. We'll do chest x-ray to rule out pneumonia, check influenza swab as it is unclear if patient was diagnosed with Flu based on clinical exam or if she had swab done. Also patient could possibly have a different type of Influenza. Sepsis protocol initiated with IVF, will hold abx for now, will give toradol and tylenol for fever and body aches, will check labs, will give IVF and zofran  Clinical Course as of Jan 15 1113  Thu Jan 13, 2017  1535 Patient feels improved at this time. Her heart rate is improved and so is the temperature. Patient's BP is 90/56 which is close to patient's baseline. UA is pending, mono is pending, strep is pending. Chest x-ray of 4 negative. Due to the length of patient's symptoms patient was started on antibiotics. If remaining of the tests are negative she'll be discharged home on a course of doxycycline for possible community acquired pneumonia. Care transferred to Dr. Lenard Lance  [CV]    Clinical Course User Index [CV] Nita Sickle, MD    Pertinent labs & imaging results that were available during my care of the patient were reviewed by me and considered in my medical decision making (see chart for details).    ____________________________________________   FINAL CLINICAL IMPRESSION(S) / ED DIAGNOSES  Final diagnoses:  Viral illness  Strep throat  Urinary tract infection without hematuria, site unspecified  Sepsis, due to unspecified organism (HCC)      NEW MEDICATIONS STARTED DURING THIS VISIT:  Current Discharge Medication List    START taking these medications   Details  doxycycline (VIBRAMYCIN) 100 MG capsule Take 1 capsule (100 mg total) by mouth 2 (two) times daily. Qty: 14 capsule,  Refills: 0    ondansetron (ZOFRAN) 4 MG tablet Take 1 tablet (4 mg total) by mouth every 8 (eight) hours as needed for nausea or vomiting. Qty: 20 tablet, Refills: 0         Note:  This document was prepared using Dragon voice recognition software and may include unintentional dictation errors.    Nita Sickle, MD 01/15/17 1114

## 2017-01-13 NOTE — ED Notes (Signed)
interpeter paged

## 2017-01-13 NOTE — ED Triage Notes (Signed)
AAOx3.  Skin warm and dry.  MAE equally and strong.  NAD 

## 2017-01-13 NOTE — H&P (Signed)
[facility] - [place] at [facility]   PATIENT NAME: Stephanie Sims    MR#:  [id number]  DATE OF BIRTH:  05/18/1981  DATE OF ADMISSION:  01/13/2017  PRIMARY CARE PHYSICIAN: [facility]   REQUESTING/REFERRING PHYSICIAN: Paduchowski MD  CHIEF COMPLAINT:   Chief Complaint  Patient presents with  . Near Syncope    HISTORY OF PRESENT ILLNESS: Stephanie Sims  is a 36 y.o. female with No medical problems who is presenting with complaint of having more than one week of coughing fevers body aches generalized weakness and fatigue. The patient in the emergency room was noted to have a temperature of 103. Tachycardic and hypotensive despite 3 L of IV fluids. Her lactic acid was only 1.1 influenza PCR was negative. She did have a strep pneumo that was positive. Chest x-ray has been negative. Patient continues to complain of severe sore throat and cough congestion and high-grade fevers at home.    PAST MEDICAL HISTORY:  Denies any medical history  PAST SURGICAL HISTORY: Past Surgical History:  Procedure Laterality Date  . APPENDECTOMY    . CHOLECYSTECTOMY      SOCIAL HISTORY:  Social History  Substance Use Topics  . Smoking status: Never Smoker  . Smokeless tobacco: Never Used  . Alcohol use No    FAMILY HISTORY: History reviewed. No pertinent family history.  DRUG ALLERGIES: No Known Allergies  REVIEW OF SYSTEMS:   CONSTITUTIONAL: Positive fever, fatigue or weakness.  EYES: No blurred or double vision.  EARS, NOSE, AND THROAT: No tinnitus or ear pain. Positive sore throat RESPIRATORY:   postive cough, postive shortness of breath, wheezing or hemoptysis.  CARDIOVASCULAR: No chest pain, orthopnea, edema.  GASTROINTESTINAL: No nausea, vomiting, diarrhea or abdominal pain.  GENITOURINARY: No dysuria, hematuria.  ENDOCRINE: No polyuria, nocturia,  HEMATOLOGY: No anemia, easy bruising or bleeding SKIN: No rash or  lesion. MUSCULOSKELETAL: No joint pain or arthritis.   NEUROLOGIC: No tingling, numbness, weakness.  PSYCHIATRY: No anxiety or depression.   MEDICATIONS AT HOME:  Prior to Admission medications   Medication Sig Start Date End Date Taking? Authorizing Provider  chlorpheniramine-HYDROcodone (TUSSIONEX PENNKINETIC ER) 10-8 MG/5ML SUER Take 5 mLs by mouth 2 (two) times daily. 01/07/17  Yes Joni Reiningonald K Smith, PA-C  ibuprofen (ADVIL,MOTRIN) 600 MG tablet Take 1 tablet (600 mg total) by mouth every 8 (eight) hours as needed. 01/07/17  Yes Joni Reiningonald K Smith, PA-C  pseudoephedrine (SUDAFED) 120 MG 12 hr tablet Take 1 tablet (120 mg total) by mouth 2 (two) times daily as needed for congestion. 01/07/17 01/07/18 Yes Joni Reiningonald K Smith, PA-C  doxycycline (VIBRAMYCIN) 100 MG capsule Take 1 capsule (100 mg total) by mouth 2 (two) times daily. 01/13/17 01/20/17  Nita Sicklearolina Veronese, MD  ondansetron (ZOFRAN) 4 MG tablet Take 1 tablet (4 mg total) by mouth every 8 (eight) hours as needed for nausea or vomiting. 01/13/17   Nita Sicklearolina Veronese, MD      PHYSICAL EXAMINATION:   VITAL SIGNS: Blood pressure (!) 96/54, pulse 87, temperature (!) 100.7 F (38.2 C), temperature source Oral, resp. rate 13, height 5\' 2"  (1.575 m), weight 123 lb (55.8 kg), SpO2 100 %.  GENERAL:  36 y.o.-year-old patient lying in the bed with no acute distress.  EYES: Pupils equal, round, reactive to light and accommodation. No scleral icterus. Extraocular muscles intact.  HEENT: Head atraumatic, normocephalic. Oropharynx Erythematous and nasopharynx clear.  NECK:  Supple, no jugular venous distention. No thyroid enlargement, no tenderness.  LUNGS:  Normal breath sounds bilaterally, no wheezing, rales,rhonchi or crepitation. No use of accessory muscles of respiration.  CARDIOVASCULAR: S1, S2 normal. No murmurs, rubs, or gallops.  ABDOMEN: Soft, nontender, nondistended. Bowel sounds present. No organomegaly or mass.  EXTREMITIES: No pedal edema, cyanosis, or  clubbing.  NEUROLOGIC: Cranial nerves II through XII are intact. Muscle strength 5/5 in all extremities. Sensation intact. Gait not checked.  PSYCHIATRIC: The patient is alert and oriented x 3.  SKIN: No obvious rash, lesion, or ulcer.   LABORATORY PANEL:   CBC  Recent Labs Lab 01/13/17 1335  WBC 12.6*  HGB 13.4  HCT 38.7  PLT 167  MCV 92.1  MCH 31.9  MCHC 34.6  RDW 12.6   ------------------------------------------------------------------------------------------------------------------  Chemistries   Recent Labs Lab 01/13/17 1335  NA 131*  K 3.4*  CL 101  CO2 22  GLUCOSE 101*  BUN 9  CREATININE 0.68  CALCIUM 8.4*   ------------------------------------------------------------------------------------------------------------------ estimated creatinine clearance is 76.9 mL/min (by C-G formula based on SCr of 0.68 mg/dL). ------------------------------------------------------------------------------------------------------------------ No results for input(s): TSH, T4TOTAL, T3FREE, THYROIDAB in the last 72 hours.  Invalid input(s): FREET3   Coagulation profile No results for input(s): INR, PROTIME in the last 168 hours. ------------------------------------------------------------------------------------------------------------------- No results for input(s): DDIMER in the last 72 hours. -------------------------------------------------------------------------------------------------------------------  Cardiac Enzymes  Recent Labs Lab 01/13/17 1335  TROPONINI <0.03   ------------------------------------------------------------------------------------------------------------------ Invalid input(s): POCBNP  ---------------------------------------------------------------------------------------------------------------  Urinalysis    Component Value Date/Time   COLORURINE YELLOW (A) 01/13/2017 1530   APPEARANCEUR CLEAR (A) 01/13/2017 1530   APPEARANCEUR Cloudy  08/12/2013 2026   LABSPEC 1.005 01/13/2017 1530   LABSPEC 1.030 08/12/2013 2026   PHURINE 7.0 01/13/2017 1530   GLUCOSEU NEGATIVE 01/13/2017 1530   GLUCOSEU Negative 08/12/2013 2026   HGBUR SMALL (A) 01/13/2017 1530   BILIRUBINUR NEGATIVE 01/13/2017 1530   BILIRUBINUR Negative 08/12/2013 2026   KETONESUR 20 (A) 01/13/2017 1530   PROTEINUR NEGATIVE 01/13/2017 1530   NITRITE NEGATIVE 01/13/2017 1530   LEUKOCYTESUR NEGATIVE 01/13/2017 1530   LEUKOCYTESUR Trace 08/12/2013 2026     RADIOLOGY: Dg Chest Port 1 View  Result Date: 01/13/2017 CLINICAL DATA:  Dry cough and fever for 4 days. EXAM: PORTABLE CHEST 1 VIEW COMPARISON:  PA and lateral chest 01/07/2017. FINDINGS: Lungs are clear. Heart size is normal. No pneumothorax or pleural fluid. No bony abnormality. IMPRESSION: Negative chest. Electronically Signed   By: Drusilla Kanner M.D.   On: 01/13/2017 14:02    EKG: Orders placed or performed during the hospital encounter of 01/13/17  . ED EKG  . ED EKG    IMPRESSION AND PLAN: Patient is a 36 year old presenting with fever and signs of sepsis  1. Sepsis could be related to strep pharyngitis I will treated with IV Unasyn Give her IV fluids Based on her age we'll check an HIV status Follow-up blood cultures  2. Hypokalemia I will give her a dose of potassium  3. Miscellaneous Lovenox for DVT prophylaxis  All the records are reviewed and case discussed with ED provider. Management plans discussed with the patient, family and they are in agreement.  CODE STATUS: Code Status History    This patient does not have a recorded code status. Please follow your organizational policy for patients in this situation.       TOTAL TIME TAKING CARE OF THIS PATIENT: 55 minutes.    Auburn Bilberry M.D on 01/13/2017 at 6:21 PM  Between 7am to 6pm - Pager - [phone]  After 6pm go to [email or web address] -  password [email or web address] [facility]  [place] [facility]  Office   [phone]  CC: Primary care physician; [facility]

## 2017-01-13 NOTE — ED Notes (Signed)
Care assumed for pt, report received 

## 2017-01-13 NOTE — ED Notes (Signed)
NAD. Pt resting. Appears to feel bad. No needs at this time. Respirations unlabored. Skin hot to touch but dry.

## 2017-01-14 LAB — CBC
HEMATOCRIT: 33 % — AB (ref 35.0–47.0)
Hemoglobin: 11.5 g/dL — ABNORMAL LOW (ref 12.0–16.0)
MCH: 32 pg (ref 26.0–34.0)
MCHC: 34.7 g/dL (ref 32.0–36.0)
MCV: 92.3 fL (ref 80.0–100.0)
PLATELETS: 140 10*3/uL — AB (ref 150–440)
RBC: 3.58 MIL/uL — AB (ref 3.80–5.20)
RDW: 12.9 % (ref 11.5–14.5)
WBC: 10.4 10*3/uL (ref 3.6–11.0)

## 2017-01-14 LAB — BASIC METABOLIC PANEL
ANION GAP: 3 — AB (ref 5–15)
BUN: 6 mg/dL (ref 6–20)
CALCIUM: 7.4 mg/dL — AB (ref 8.9–10.3)
CO2: 22 mmol/L (ref 22–32)
Chloride: 112 mmol/L — ABNORMAL HIGH (ref 101–111)
Creatinine, Ser: 0.53 mg/dL (ref 0.44–1.00)
GLUCOSE: 81 mg/dL (ref 65–99)
POTASSIUM: 3.3 mmol/L — AB (ref 3.5–5.1)
Sodium: 137 mmol/L (ref 135–145)

## 2017-01-14 MED ORDER — POTASSIUM CHLORIDE CRYS ER 10 MEQ PO TBCR
20.0000 meq | EXTENDED_RELEASE_TABLET | Freq: Once | ORAL | Status: AC
  Administered 2017-01-14: 20 meq via ORAL
  Filled 2017-01-14: qty 2

## 2017-01-14 NOTE — Progress Notes (Signed)
[facility] -  at [facility]   PATIENT NAME: Stephanie Sims    MR#:  [id number]  DATE OF BIRTH:  [birth year]  SUBJECTIVE:  CHIEF COMPLAINT:   Patient is reporting throat pain and feverish REVIEW OF SYSTEMS:  CONSTITUTIONAL: No fever, fatigue or weakness.  EYES: No blurred or double vision.  EARS, NOSE, AND THROAT: No tinnitus or ear pain. Reporting throat pain but tolerating diet RESPIRATORY: No cough, shortness of breath, wheezing or hemoptysis.  CARDIOVASCULAR: No chest pain, orthopnea, edema.  GASTROINTESTINAL: No nausea, vomiting, diarrhea or abdominal pain.  GENITOURINARY: No dysuria, hematuria.  ENDOCRINE: No polyuria, nocturia,  HEMATOLOGY: No anemia, easy bruising or bleeding SKIN: No rash or lesion. MUSCULOSKELETAL: No joint pain or arthritis.   NEUROLOGIC: No tingling, numbness, weakness.  PSYCHIATRY: No anxiety or depression.   DRUG ALLERGIES:  No Known Allergies  VITALS:  Blood pressure (!) 96/56, pulse 99, temperature (!) 101.6 F (38.7 C), temperature source Oral, resp. rate 18, height  (1.575 m), weight 58.2 kg (128 lb 4.8 oz), SpO2 97 %.  PHYSICAL EXAMINATION:  GENERAL:  36 y.o.-year-old patient lying in the bed with no acute distress.  EYES: Pupils equal, round, reactive to light and accommodation. No scleral icterus. Extraocular muscles intact.  HEENT: Head atraumatic, normocephalic. Oropharynx -Erythematous but uvula in the midline  NECK:  Supple, no jugular venous distention. No thyroid enlargement, no tenderness.  LUNGS: Normal breath sounds bilaterally, no wheezing, rales,rhonchi or crepitation. No use of accessory muscles of respiration.  CARDIOVASCULAR: S1, S2 normal. No murmurs, rubs, or gallops.  ABDOMEN: Soft, nontender, nondistended. Bowel sounds present. No organomegaly or mass.  EXTREMITIES: No pedal edema, cyanosis, or clubbing.  NEUROLOGIC: Cranial nerves II through XII are intact. Muscle strength  5/5 in all extremities. Sensation intact. Gait not checked.  PSYCHIATRIC: The patient is alert and oriented x 3.  SKIN: No obvious rash, lesion, or ulcer.    LABORATORY PANEL:   CBC  Recent Labs Lab 01/14/17 0545  WBC 10.4  HGB 11.5*  HCT 33.0*  PLT 140*   ------------------------------------------------------------------------------------------------------------------  Chemistries   Recent Labs Lab 01/14/17 0545  NA 137  K 3.3*  CL 112*  CO2 22  GLUCOSE 81  BUN 6  CREATININE 0.53  CALCIUM 7.4*   ------------------------------------------------------------------------------------------------------------------  Cardiac Enzymes  Recent Labs Lab 01/13/17 1335  TROPONINI <0.03   ------------------------------------------------------------------------------------------------------------------  RADIOLOGY:  Dg Chest Port 1 View  Result Date: 01/13/2017 CLINICAL DATA:  Dry cough and fever for 4 days. EXAM: PORTABLE CHEST 1 VIEW COMPARISON:  PA and lateral chest 01/07/2017. FINDINGS: Lungs are clear. Heart size is normal. No pneumothorax or pleural fluid. No bony abnormality. IMPRESSION: Negative chest. Electronically Signed   By: Drusilla Kanner M.D.   On: 01/13/2017 14:02    EKG:   Orders placed or performed during the hospital encounter of 01/13/17  . ED EKG  . ED EKG    ASSESSMENT AND PLAN:    Patient is a 36 year old presenting with fever and signs of sepsis  1. Sepsis  related to strep pharyngitis-febrile and hypotensive Strep test is positive  IV Unasyn Pain management as needed Give her IV fluids Influenza test is negative   blood and urine cultures are negativeFollow-up blood cultures  2. Hypokalemia-replaced and recheck   3. Throat pain-pain management as needed tolerating diet  3. Miscellaneous Lovenox for DVT prophylaxis    All the records are reviewed and case discussed with Care Management/Social Workerr. Management plans  discussed with the patient, With the help of Spanish interpreter  CODE STATUS: fc   TOTAL TIME TAKING CARE OF THIS PATIENT: 36 minutes.   POSSIBLE D/C IN 1-2  DAYS, DEPENDING ON CLINICAL CONDITION.  Note: This dictation was prepared with Dragon dictation along with smaller phrase technology. Any transcriptional errors that result from this process are unintentional.   Ramonita Lab M.D on 01/14/2017 at 5:12 PM  Between 7am to 6pm - Pager - [phone] After 6pm go to [email or web address] - password [email or web address] [facility]  [place] [facility]  Office  [phone]  CC: Primary care physician; [facility]

## 2017-01-14 NOTE — Progress Notes (Signed)
Pharmacy Antibiotic Note  Stephanie Sims is a 36 y.o. female admitted on 01/13/2017 with sepsis secondary to strep pharyngitis.  Pharmacy has been consulted for ampicillin/sulbactam dosing.  Patient received one dose of CTX and azithromycin in the ED. Is being started on ampicillin/sulbactam on admission.  Plan: Will order ampicillin/sulbactam 3 g IV q6h   Height:  (157.5 cm) Weight: 128 lb 4.8 oz (58.2 kg) IBW/kg (Calculated) : 50.1  Temp (24hrs), Avg:100.2 F (37.9 C), Min:98.5 F (36.9 C), Max:103 F (39.4 C)   Recent Labs Lab 01/13/17 1335 01/13/17 1403 01/14/17 0545  WBC 12.6*  --  10.4  CREATININE 0.68  --  0.53  LATICACIDVEN  --  1.1  --     Estimated Creatinine Clearance: 76.9 mL/min (by C-G formula based on SCr of 0.53 mg/dL).    No Known Allergies  Antimicrobials this admission: CTX and azithromycin one dose in ED on 3/29 Ampicillin/sulbactam 3/29 >>   Dose adjustments this admission:  Microbiology results: 3/29 BCx: Sent 3/29 UCx: Sent   Thank you for allowing pharmacy to be a part of this patient's care.  Angelique Blonder, PharmD, BCPS Clinical Pharmacist 01/14/2017 12:57 PM

## 2017-01-14 NOTE — Progress Notes (Signed)
Patient refuses teds and scd's.

## 2017-01-15 LAB — CBC WITH DIFFERENTIAL/PLATELET
BASOS ABS: 0 10*3/uL (ref 0–0.1)
BASOS PCT: 0 %
EOS PCT: 2 %
Eosinophils Absolute: 0.2 10*3/uL (ref 0–0.7)
HCT: 32.1 % — ABNORMAL LOW (ref 35.0–47.0)
Hemoglobin: 11.2 g/dL — ABNORMAL LOW (ref 12.0–16.0)
Lymphocytes Relative: 26 %
Lymphs Abs: 2.1 10*3/uL (ref 1.0–3.6)
MCH: 32.3 pg (ref 26.0–34.0)
MCHC: 34.8 g/dL (ref 32.0–36.0)
MCV: 92.9 fL (ref 80.0–100.0)
MONO ABS: 0.5 10*3/uL (ref 0.2–0.9)
Monocytes Relative: 6 %
Neutro Abs: 5.1 10*3/uL (ref 1.4–6.5)
Neutrophils Relative %: 66 %
Platelets: 144 10*3/uL — ABNORMAL LOW (ref 150–440)
RBC: 3.46 MIL/uL — AB (ref 3.80–5.20)
RDW: 12.9 % (ref 11.5–14.5)
WBC: 7.9 10*3/uL (ref 3.6–11.0)

## 2017-01-15 LAB — BASIC METABOLIC PANEL
Anion gap: 3 — ABNORMAL LOW (ref 5–15)
BUN: 8 mg/dL (ref 6–20)
CHLORIDE: 112 mmol/L — AB (ref 101–111)
CO2: 23 mmol/L (ref 22–32)
CREATININE: 0.51 mg/dL (ref 0.44–1.00)
Calcium: 7.8 mg/dL — ABNORMAL LOW (ref 8.9–10.3)
GFR calc Af Amer: >60 mL/min (ref >60)
GFR calc non Af Amer: >60 mL/min (ref >60)
GLUCOSE: 92 mg/dL (ref 65–99)
Potassium: 3.8 mmol/L (ref 3.5–5.1)
Sodium: 138 mmol/L (ref 135–145)

## 2017-01-15 LAB — URINE CULTURE: CULTURE: NO GROWTH

## 2017-01-15 LAB — HEMOGLOBIN A1C
Hgb A1c MFr Bld: 5.4 % (ref 4.8–5.6)
MEAN PLASMA GLUCOSE: 108 mg/dL

## 2017-01-15 LAB — HIV ANTIBODY (ROUTINE TESTING W REFLEX): HIV SCREEN 4TH GENERATION: NONREACTIVE

## 2017-01-15 MED ORDER — SODIUM CHLORIDE 0.9 % IV BOLUS (SEPSIS)
1000.0000 mL | Freq: Once | INTRAVENOUS | Status: AC
  Administered 2017-01-15: 1000 mL via INTRAVENOUS

## 2017-01-15 MED ORDER — AMOXICILLIN 500 MG PO CAPS: 500.0000 mg | ORAL_CAPSULE | Freq: Two times a day (BID) | ORAL | 0 refills | Status: AC

## 2017-01-15 NOTE — Progress Notes (Signed)
[facility]         [place], [place].   01/15/2017  Patient: Stephanie Sims   Date of Birth:  12-04-1980  Date of admission:  01/13/2017  Date of Discharge  01/15/2017    To Whom it May Concern:   Mora Bellman  may return to work on 01/18/17.  PHYSICAL ACTIVITY:  Full  If you have any questions or concerns, please don't hesitate to call.  Sincerely,   Ramonita Lab M.D Pager [phone] Office : [phone]   .

## 2017-01-15 NOTE — Discharge Instructions (Signed)
Follow-up with primary care physician in a week Complete the antibiotic course as prescribed

## 2017-01-15 NOTE — Discharge Summary (Signed)
[facility] [place] at [facility]   PATIENT NAME: Stephanie Sims    MR#:  [id number]  DATE OF BIRTH:  Jul 30, 1981  DATE OF ADMISSION:  01/13/2017 ADMITTING PHYSICIAN: Auburn Bilberry, MD  DATE OF DISCHARGE: 01/15/17 PRIMARY CARE PHYSICIAN: [facility]    ADMISSION DIAGNOSIS:  Strep throat [J02.0] Viral illness [B34.9] Sepsis, due to unspecified organism (HCC) [A41.9] Urinary tract infection without hematuria, site unspecified [N39.0]  DISCHARGE DIAGNOSIS:  Active Problems:   Sepsis (HCC) Streptococcal pharyngitis Throat pain  SECONDARY DIAGNOSIS:  History reviewed. No pertinent past medical history.  HOSPITAL COURSE:   1. Sepsis  related to strep pharyngitis-febrile and hypotensive Strep test is positive  IV UnasynWas given during the hospital course will discharge patient with by mouth amoxicillin 500 mg for 10 days Pain management as needed Give her IV fluids Influenza test is negative   blood and urine cultures are negative  2. Hypokalemia-replaced and Potassium at 3.8  3. Throat pain-pain management as needed tolerating diet  3. Miscellaneous Lovenox for DVT prophylaxis   DISCHARGE CONDITIONS:   stable  CONSULTS OBTAINED:     PROCEDURES none  DRUG ALLERGIES:  No Known Allergies  DISCHARGE MEDICATIONS:   Current Discharge Medication List    START taking these medications   Details  amoxicillin (AMOXIL) 500 MG capsule Take 1 capsule (500 mg total) by mouth 2 (two) times daily. Qty: 20 capsule, Refills: 0    ondansetron (ZOFRAN) 4 MG tablet Take 1 tablet (4 mg total) by mouth every 8 (eight) hours as needed for nausea or vomiting. Qty: 20 tablet, Refills: 0      CONTINUE these medications which have NOT CHANGED   Details  chlorpheniramine-HYDROcodone (TUSSIONEX PENNKINETIC ER) 10-8 MG/5ML SUER Take 5 mLs by mouth 2 (two) times daily. Qty: 115 mL, Refills: 0    ibuprofen (ADVIL,MOTRIN)  600 MG tablet Take 1 tablet (600 mg total) by mouth every 8 (eight) hours as needed. Qty: 15 tablet, Refills: 0    pseudoephedrine (SUDAFED) 120 MG 12 hr tablet Take 1 tablet (120 mg total) by mouth 2 (two) times daily as needed for congestion. Qty: 20 tablet, Refills: 0         DISCHARGE INSTRUCTIONS:   Follow-up with primary care physician in a week Complete the antibiotic course as prescribed   DIET:  Regular diet  DISCHARGE CONDITION:  Stable  ACTIVITY:  Activity as tolerated  OXYGEN:  Home Oxygen: No.   Oxygen Delivery: room air  DISCHARGE LOCATION:  home   If you experience worsening of your admission symptoms, develop shortness of breath, life threatening emergency, suicidal or homicidal thoughts you must seek medical attention immediately by calling 911 or calling your MD immediately  if symptoms less severe.  You Must read complete instructions/literature along with all the possible adverse reactions/side effects for all the Medicines you take and that have been prescribed to you. Take any new Medicines after you have completely understood and accpet all the possible adverse reactions/side effects.   Please note  You were cared for by a hospitalist during your hospital stay. If you have any questions about your discharge medications or the care you received while you were in the hospital after you are discharged, you can call the unit and asked to speak with the hospitalist on call if the hospitalist that took care of you is not available. Once you are discharged, your primary care physician will handle any further medical issues. Please  note that NO REFILLS for any discharge medications will be authorized once you are discharged, as it is imperative that you return to your primary care physician (or establish a relationship with a primary care physician if you do not have one) for your aftercare needs so that they can reassess your need for medications and monitor  your lab values.     Today  Chief Complaint  Patient presents with  . Near Syncope   Patient has been tolerating diet has some throat pain but significantly improved. No fever for the past 24 hours. Wants to go home  ROS:  CONSTITUTIONAL: Denies fevers, chills. Denies any fatigue, weakness.  EYES: Denies blurry vision, double vision, eye pain. EARS, NOSE, THROAT: Improved throat pain Denies tinnitus, ear pain, hearing loss. RESPIRATORY: Denies cough, wheeze, shortness of breath.  CARDIOVASCULAR: Denies chest pain, palpitations, edema.  GASTROINTESTINAL: Denies nausea, vomiting, diarrhea, abdominal pain. Denies bright red blood per rectum. GENITOURINARY: Denies dysuria, hematuria. ENDOCRINE: Denies nocturia or thyroid problems. HEMATOLOGIC AND LYMPHATIC: Denies easy bruising or bleeding. SKIN: Denies rash or lesion. MUSCULOSKELETAL: Denies pain in neck, back, shoulder, knees, hips or arthritic symptoms.  NEUROLOGIC: Denies paralysis, paresthesias.  PSYCHIATRIC: Denies anxiety or depressive symptoms.   VITAL SIGNS:  Blood pressure (!) 90/55, pulse 74, temperature 98.7 F (37.1 C), temperature source Oral, resp. rate 18, height  (1.575 m), weight 58.2 kg (128 lb 4.8 oz), SpO2 96 %.  I/O:    Intake/Output Summary (Last 24 hours) at 01/15/17 1240 Last data filed at 01/15/17 0131  Gross per 24 hour  Intake          2651.67 ml  Output                0 ml  Net          2651.67 ml    PHYSICAL EXAMINATION:  GENERAL:  36 y.o.-year-old patient lying in the bed with no acute distress.  EYES: Pupils equal, round, reactive to light and accommodation. No scleral icterus. Extraocular muscles intact.  HEENT: Oral cavity erythematous tonsillar area with decreased  Edema, uvula is in midline  Head atraumatic, normocephalic. No trismus NECK:  Supple, no jugular venous distention. No thyroid enlargement, no tenderness.  LUNGS: Normal breath sounds bilaterally, no wheezing,  rales,rhonchi or crepitation. No use of accessory muscles of respiration.  CARDIOVASCULAR: S1, S2 normal. No murmurs, rubs, or gallops.  ABDOMEN: Soft, non-tender, non-distended. Bowel sounds present. No organomegaly or mass.  EXTREMITIES: No pedal edema, cyanosis, or clubbing.  NEUROLOGIC: Cranial nerves II through XII are intact. Muscle strength 5/5 in all extremities. Sensation intact. Gait not checked.  PSYCHIATRIC: The patient is alert and oriented x 3.  SKIN: No obvious rash, lesion, or ulcer.   DATA REVIEW:   CBC  Recent Labs Lab 01/15/17 0340  WBC 7.9  HGB 11.2*  HCT 32.1*  PLT 144*    Chemistries   Recent Labs Lab 01/15/17 0340  NA 138  K 3.8  CL 112*  CO2 23  GLUCOSE 92  BUN 8  CREATININE 0.51  CALCIUM 7.8*    Cardiac Enzymes  Recent Labs Lab 01/13/17 1335  TROPONINI <0.03    Microbiology Results  Results for orders placed or performed during the hospital encounter of 01/13/17  Blood Culture (routine x 2)     Status: None (Preliminary result)   Collection Time: 01/13/17  2:03 PM  Result Value Ref Range Status   Specimen Description BLOOD  L HAND  Final  Special Requests BOTTLES DRAWN AEROBIC AND ANAEROBIC BCAV  Final   Culture NO GROWTH 2 DAYS  Final   Report Status PENDING  Incomplete  Blood Culture (routine x 2)     Status: None (Preliminary result)   Collection Time: 01/13/17  2:03 PM  Result Value Ref Range Status   Specimen Description BLOOD R HAND  Final   Special Requests BOTTLES DRAWN AEROBIC AND ANAEROBIC BCAV  Final   Culture NO GROWTH 2 DAYS  Final   Report Status PENDING  Incomplete  Urine culture     Status: None   Collection Time: 01/13/17  3:30 PM  Result Value Ref Range Status   Specimen Description URINE, RANDOM  Final   Special Requests NONE  Final   Culture   Final    NO GROWTH Performed at [facility], [address], [place], [place] [address]    Report Status 01/15/2017 FINAL  Final    RADIOLOGY:  Dg  Chest Port 1 View  Result Date: 01/13/2017 CLINICAL DATA:  Dry cough and fever for 4 days. EXAM: PORTABLE CHEST 1 VIEW COMPARISON:  PA and lateral chest 01/07/2017. FINDINGS: Lungs are clear. Heart size is normal. No pneumothorax or pleural fluid. No bony abnormality. IMPRESSION: Negative chest. Electronically Signed   By: Drusilla Kanner M.D.   On: 01/13/2017 14:02    EKG:   Orders placed or performed during the hospital encounter of 01/13/17  . ED EKG  . ED EKG      Management plans discussed with the patient, family and they are in agreement.  CODE STATUS:     Code Status Orders        Start     Ordered   01/13/17 2023  Full code  Continuous     01/13/17 2022    Code Status History    Date Active Date Inactive Code Status Order ID Comments User Context   This patient has a current code status but no historical code status.      TOTAL TIME TAKING CARE OF THIS PATIENT: 38 minutes.   Note: This dictation was prepared with Dragon dictation along with smaller phrase technology. Any transcriptional errors that result from this process are unintentional.   @  on 01/15/2017 at 12:40 PM  Between 7am to 6pm - Pager - [phone]  After 6pm go to [email or web address] - password [email or web address] [facility]  [facility]  Office  [phone]  CC: Primary care physician; [facility]

## 2017-01-18 LAB — CULTURE, BLOOD (ROUTINE X 2)
CULTURE: NO GROWTH
Culture: NO GROWTH

## 2018-04-17 IMAGING — DX DG CHEST 1V PORT
1 series · 1 of 1 positions shown · non-contrast
Comparison: PA and lateral chest 01/07/2017.

CLINICAL DATA: Dry cough and fever for 4 days.

EXAM:
PORTABLE CHEST 1 VIEW

[chest ap]
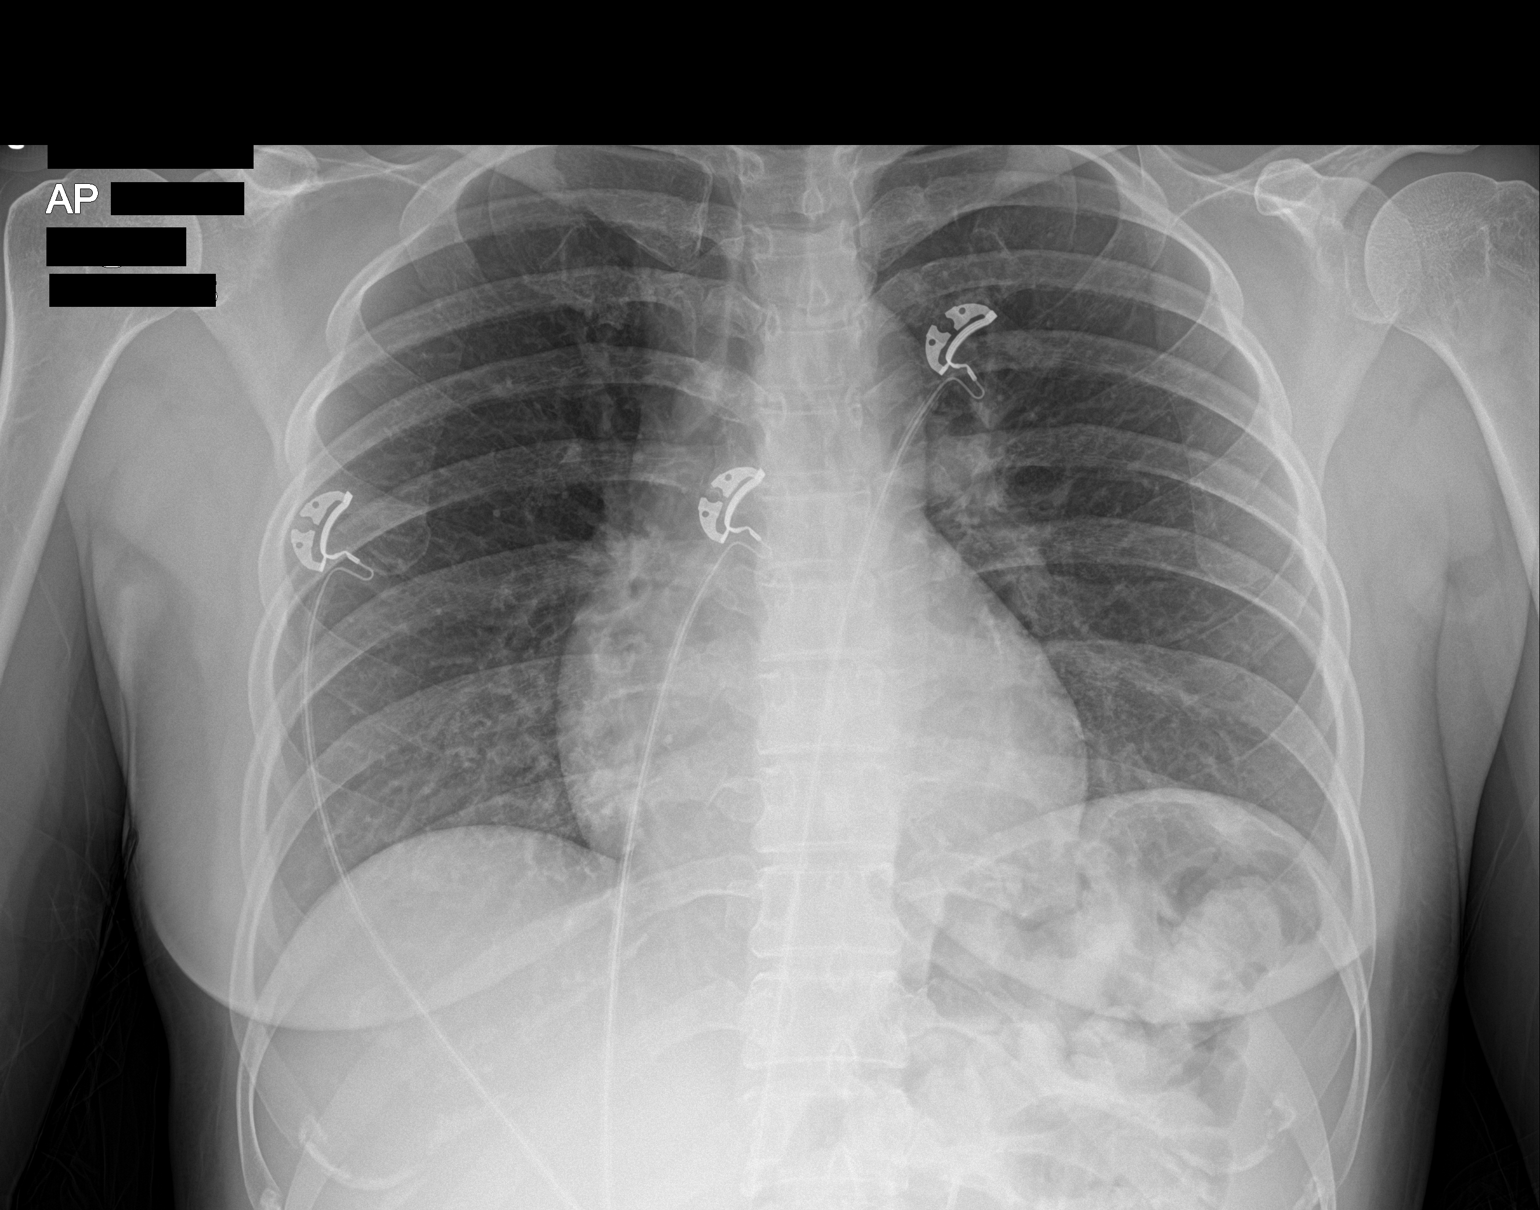

[1 of 1 positions shown; findings below may reference images not displayed]

FINDINGS: Lungs are clear. Heart size is normal. No pneumothorax or pleural
fluid. No bony abnormality.
IMPRESSION: Negative chest.

## 2024-01-17 ENCOUNTER — Other Ambulatory Visit: Payer: Self-pay

## 2024-01-17 ENCOUNTER — Emergency Department: Payer: Worker's Compensation

## 2024-01-17 ENCOUNTER — Encounter: Payer: Self-pay | Admitting: Emergency Medicine

## 2024-01-17 ENCOUNTER — Emergency Department: Payer: Self-pay

## 2024-01-17 ENCOUNTER — Emergency Department
Admission: EM | Admit: 2024-01-17 | Discharge: 2024-01-17 | Disposition: A | Discharge status: Home / Self Care | Payer: Worker's Compensation | Attending: Emergency Medicine | Admitting: Emergency Medicine

## 2024-01-17 DIAGNOSIS — R519 Headache, unspecified: Secondary | ICD-10-CM | POA: Diagnosis present

## 2024-01-17 DIAGNOSIS — S7001XA Contusion of right hip, initial encounter: Secondary | ICD-10-CM | POA: Insufficient documentation

## 2024-01-17 DIAGNOSIS — S0093XA Contusion of unspecified part of head, initial encounter: Secondary | ICD-10-CM | POA: Insufficient documentation

## 2024-01-17 DIAGNOSIS — S300XXA Contusion of lower back and pelvis, initial encounter: Secondary | ICD-10-CM | POA: Insufficient documentation

## 2024-01-17 DIAGNOSIS — W010XXA Fall on same level from slipping, tripping and stumbling without subsequent striking against object, initial encounter: Secondary | ICD-10-CM | POA: Diagnosis not present

## 2024-01-17 DIAGNOSIS — S1093XA Contusion of unspecified part of neck, initial encounter: Secondary | ICD-10-CM | POA: Diagnosis not present

## 2024-01-17 DIAGNOSIS — T07XXXA Unspecified multiple injuries, initial encounter: Secondary | ICD-10-CM

## 2024-01-17 DIAGNOSIS — Y990 Civilian activity done for income or pay: Secondary | ICD-10-CM | POA: Diagnosis not present

## 2024-01-17 DIAGNOSIS — W19XXXA Unspecified fall, initial encounter: Secondary | ICD-10-CM

## 2024-01-17 MED ORDER — KETOROLAC TROMETHAMINE 30 MG/ML IJ SOLN
30.0000 mg | Freq: Once | INTRAMUSCULAR | Status: AC
  Administered 2024-01-17: 30 mg via INTRAVENOUS
  Filled 2024-01-17: qty 1

## 2024-01-17 MED ORDER — ONDANSETRON HCL 4 MG/2ML IJ SOLN
4.0000 mg | Freq: Once | INTRAMUSCULAR | Status: AC
  Administered 2024-01-17: 4 mg via INTRAVENOUS
  Filled 2024-01-17: qty 2

## 2024-01-17 MED ORDER — OXYCODONE-ACETAMINOPHEN 5-325 MG PO TABS
1.0000 | ORAL_TABLET | Freq: Once | ORAL | Status: AC
  Administered 2024-01-17: 1 via ORAL
  Filled 2024-01-17: qty 1

## 2024-01-17 MED ORDER — MELOXICAM 15 MG PO TABS: 15.0000 mg | ORAL_TABLET | Freq: Every day | ORAL | 0 refills | Status: AC

## 2024-01-17 MED ORDER — METHOCARBAMOL 500 MG PO TABS: 500.0000 mg | ORAL_TABLET | Freq: Four times a day (QID) | ORAL | 0 refills | Status: AC

## 2024-01-17 MED ORDER — MORPHINE SULFATE (PF) 4 MG/ML IV SOLN
4.0000 mg | Freq: Once | INTRAVENOUS | Status: AC
  Administered 2024-01-17: 4 mg via INTRAVENOUS
  Filled 2024-01-17: qty 1

## 2024-01-17 MED ORDER — METHOCARBAMOL 500 MG PO TABS
1000.0000 mg | ORAL_TABLET | Freq: Once | ORAL | Status: AC
  Administered 2024-01-17: 1000 mg via ORAL
  Filled 2024-01-17: qty 2

## 2024-01-17 NOTE — ED Provider Triage Note (Signed)
 Emergency Medicine Provider Triage Evaluation Note  Stephanie Sims , a 43 y.o. female  was evaluated in triage.  Pt complains of fall. Pt did hit her head. She does think she passed out. Fall was mechanical, patient slipped.  Review of Systems  Positive: Right hip pain Negative:   Physical Exam  There were no vitals taken for this visit. Gen:   Awake, no distress   Resp:  Normal effort  MSK:   Moves extremities without difficulty  Other:    Medical Decision Making  Medically screening exam initiated at 4:10 PM.  Appropriate orders placed.  Stephanie Sims was informed that the remainder of the evaluation will be completed by another provider, this initial triage assessment does not replace that evaluation, and the importance of remaining in the ED until their evaluation is complete.     Cameron Ali, PA-C 01/17/24 [phone]

## 2024-01-17 NOTE — ED Notes (Signed)
 Written and verbal discharge instructions reviewed with patient with assistance from language line interpreter, understanding verbalized, denied questions. Discharged from unit ambulatory in good condition with significant other.

## 2024-01-17 NOTE — ED Triage Notes (Addendum)
 First nurse note: Arrived by [facility] from work. Reports slipping at work and fell two feet on metal step, hitting right lower back.   Patient spanish speaking  A&O x4 per EMS  Cannot put weight on right foot  fentanyl 4mg  zofran  EMS vitals: 100/60b/p 70HR 14RR

## 2024-01-17 NOTE — ED Triage Notes (Signed)
 Patient to ED via [facility] from work. PT reports she slipped and fell hitting her head. C/o right sided hip pain, headache, and neck pain. Possible LOC, no blood thinners.

## 2024-01-17 NOTE — ED Provider Notes (Signed)
 [facility] Provider Note  Patient Contact: 8:10 PM (approximate)   History   Fall   HPI  Stephanie Sims is a 43 y.o. female who presents to the emergency department complaining of a mechanical fall at work.  Patient was walking next to a machine at work when there was some oil in the floor causing her to slip and fall.  Patient fell sideways landing on her right hip.  She did hit her head and is unsure but believes she may have briefly lost consciousness.  Patient is complaining of headache, neck pain, low back pain, hip pain.  Patient received fentanyl and route by EMS.  Patient denies any bowel or bladder dysfunction, saddle anesthesia or paresthesias.  Patient states that most of the pain currently is located in her low back/sacral region     Physical Exam   Triage Vital Signs: ED Triage Vitals  Encounter Vitals Group     BP 01/17/24 1611 101/70     Systolic BP Percentile --      Diastolic BP Percentile --      Pulse Rate 01/17/24 1611 68     Resp 01/17/24 1611 17     Temp 01/17/24 1611 98.4 F (36.9 C)     Temp Source 01/17/24 1611 Oral     SpO2 01/17/24 1611 99 %     Weight 01/17/24 1612 127 lb 13.9 oz (58 kg)     Height 01/17/24 1612 5\' 2"  (1.575 m)     Head Circumference --      Peak Flow --      Pain Score 01/17/24 1611 4     Pain Loc --      Pain Education --      Exclude from Growth Chart --     Most recent vital signs: Vitals:   01/17/24 1611 01/17/24 2028  BP: 101/70 100/62  Pulse: 68 67  Resp: 17 17  Temp: 98.4 F (36.9 C)   SpO2: 99% 99%     General: Alert and in no acute distress. Eyes:  PERRL. EOMI. Head: No acute traumatic findings  Neck: No stridor. No cervical spine tenderness to palpation.  Cardiovascular:  Good peripheral perfusion Respiratory: Normal respiratory effort without tachypnea or retractions. Lungs CTAB. Good air entry to the bases with no decreased or absent breath  sounds Musculoskeletal: Full range of motion to all extremities.  Visualization of the lumbar spine reveals no visible signs of trauma.  Patient is diffusely benign tender in the lower lumbar extending into the coccyx region of the spine.  No palpable abnormality or step-off.  Pulses intact bilateral lower extremity, sensation intact and equal bilateral lower extremities. Neurologic:  No gross focal neurologic deficits are appreciated.  Skin:   No rash noted Other:   ED Results / Procedures / Treatments   Labs (all labs ordered are listed, but only abnormal results are displayed) Labs Reviewed  POC URINE PREG, ED     EKG     RADIOLOGY  I personally viewed, evaluated, and interpreted these images as part of my medical decision making, as well as reviewing the written report by the radiologist.  ED Provider Interpretation: CT scan of the head and cervical spine are reassuring with no acute traumatic finding.  X-ray of the right hip reveals no acute finding.  No acute traumatic finding to the lumbar spine or coccyx  DG Sacrum/Coccyx Result Date: 01/17/2024 CLINICAL DATA:  Fall, lumbar pain EXAM: SACRUM AND COCCYX -  2+ VIEW COMPARISON:  None Available. FINDINGS: There is no evidence of fracture or other focal bone lesions. IMPRESSION: Negative. Electronically Signed   By: Minerva Fester M.D.   On: 01/17/2024 21:58   DG Lumbar Spine 2-3 Views Result Date: 01/17/2024 CLINICAL DATA:  Lumbar pain after fall EXAM: LUMBAR SPINE - 2-3 VIEW COMPARISON:  CT abdomen pelvis 08/12/2013 FINDINGS: No evidence of acute fracture or traumatic listhesis. Intervertebral disc space height is maintained. Mild multilevel spondylosis and facet arthropathy. IMPRESSION: No acute fracture or traumatic listhesis. Electronically Signed   By: Minerva Fester M.D.   On: 01/17/2024 21:57   CT Cervical Spine Wo Contrast Result Date: 01/17/2024 CLINICAL DATA:  Neck trauma, fall. EXAM: CT CERVICAL SPINE WITHOUT CONTRAST  TECHNIQUE: Multidetector CT imaging of the cervical spine was performed without intravenous contrast. Multiplanar CT image reconstructions were also generated. RADIATION DOSE REDUCTION: This exam was performed according to the departmental dose-optimization program which includes automated exposure control, adjustment of the mA and/or kV according to patient size and/or use of iterative reconstruction technique. COMPARISON:  None Available. FINDINGS: Alignment: Normal Skull base and vertebrae: No acute fracture. No primary bone lesion or focal pathologic process. Soft tissues and spinal canal: No prevertebral fluid or swelling. No visible canal hematoma. Disc levels:  Negative Upper chest: No acute findings Other: None IMPRESSION: No acute bony abnormality. Electronically Signed   By: Charlett Nose M.D.   On: 01/17/2024 18:01   CT Head Wo Contrast Result Date: 01/17/2024 CLINICAL DATA:  Head trauma, moderate-severe.  Fall. EXAM: CT HEAD WITHOUT CONTRAST TECHNIQUE: Contiguous axial images were obtained from the base of the skull through the vertex without intravenous contrast. RADIATION DOSE REDUCTION: This exam was performed according to the departmental dose-optimization program which includes automated exposure control, adjustment of the mA and/or kV according to patient size and/or use of iterative reconstruction technique. COMPARISON:  None Available. FINDINGS: Brain: No acute intracranial abnormality. Specifically, no hemorrhage, hydrocephalus, mass lesion, acute infarction, or significant intracranial injury. Vascular: No hyperdense vessel or unexpected calcification. Skull: No acute calvarial abnormality. Sinuses/Orbits: No acute findings Other: None IMPRESSION: No acute intracranial abnormality. Electronically Signed   By: Charlett Nose M.D.   On: 01/17/2024 17:59   DG Hip Unilat With Pelvis 2-3 Views Right Result Date: 01/17/2024 CLINICAL DATA:  Pain after fall. EXAM: DG HIP (WITH OR WITHOUT PELVIS) 2-3V  RIGHT COMPARISON:  None Available. FINDINGS: Pelvis is intact with normal and symmetric sacroiliac joints. No acute fracture or dislocation. No aggressive osseous lesion. Visualized sacral arcuate lines are unremarkable. Unremarkable symphysis pubis. There are mild degenerative changes of bilateral hip joints without significant joint space narrowing. Osteophytosis of the superior acetabulum. No radiopaque foreign bodies. IMPRESSION: *No acute osseous abnormality of the pelvis or right hip joint. Electronically Signed   By: Jules Schick M.D.   On: 01/17/2024 17:43    PROCEDURES:  Critical Care performed: No  Procedures   MEDICATIONS ORDERED IN ED: Medications  oxyCODONE-acetaminophen (PERCOCET/ROXICET) 5-325 MG per tablet 1 tablet (has no administration in time range)  ketorolac (TORADOL) 30 MG/ML injection 30 mg (has no administration in time range)  methocarbamol (ROBAXIN) tablet 1,000 mg (has no administration in time range)  morphine (PF) 4 MG/ML injection 4 mg (4 mg Intravenous Given 01/17/24 2029)  ondansetron (ZOFRAN) injection 4 mg (4 mg Intravenous Given 01/17/24 2032)     IMPRESSION / MDM / ASSESSMENT AND PLAN / ED COURSE  I reviewed the triage vital signs and the nursing  notes.                                 Differential diagnosis includes, but is not limited to, fall, skull fracture, intracranial hemorrhage, concussion, compression fracture, hip fracture, dislocation of the hip   Patient's presentation is most consistent with acute presentation with potential threat to life or bodily function.   Patient's diagnosis is consistent with fall, multiple contusions.  Patient presents to the emergency department after mechanical fall.  Patient slipped and fell.  Patient reports that she may have briefly lost consciousness.  Patient is neurologically intact on exam.  Given the multiple pain complaints imaging was obtained without acute traumatic finding.  Patient will be treated  symptomatically at home with anti-inflammatory and muscle relaxer..  Follow-up primary care as needed.  Patient is given ED precautions to return to the ED for any worsening or new symptoms.     FINAL CLINICAL IMPRESSION(S) / ED DIAGNOSES   Final diagnoses:  Fall, initial encounter  Multiple contusions     Rx / DC Orders   ED Discharge Orders          Ordered    meloxicam (MOBIC) 15 MG tablet  Daily        01/17/24 2230    methocarbamol (ROBAXIN) 500 MG tablet  4 times daily        01/17/24 2230             Note:  This document was prepared using Dragon voice recognition software and may include unintentional dictation errors.   Lanette Hampshire 01/17/24 2230    Minna Antis, MD 01/17/24 [phone]
